# Patient Record
Sex: Male | Born: 2000 | Race: White | Hispanic: No | Marital: Single | Smoking: Never smoker
Health system: Southern US, Community
[De-identification: ages and names within clinical notes are randomized; demographics above are authoritative.]

## PROBLEM LIST (undated history)

## (undated) DIAGNOSIS — F419 Anxiety disorder, unspecified: Secondary | ICD-10-CM

## (undated) DIAGNOSIS — F410 Panic disorder [episodic paroxysmal anxiety] without agoraphobia: Secondary | ICD-10-CM

## (undated) HISTORY — PX: TONSILLECTOMY: SUR1361

## (undated) HISTORY — PX: CYSTOSCOPY: SUR368

## (undated) HISTORY — PX: UPPER GASTROINTESTINAL ENDOSCOPY: SHX188

---
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
## 2020-05-08 ENCOUNTER — Emergency Department: Payer: 59

## 2020-05-08 ENCOUNTER — Other Ambulatory Visit: Payer: Self-pay

## 2020-05-08 ENCOUNTER — Emergency Department
Admission: EM | Admit: 2020-05-08 | Discharge: 2020-05-08 | Disposition: A | Discharge status: Home / Self Care | Payer: 59 | Attending: Emergency Medicine | Admitting: Emergency Medicine

## 2020-05-08 DIAGNOSIS — Z2839 Other underimmunization status: Secondary | ICD-10-CM | POA: Diagnosis not present

## 2020-05-08 DIAGNOSIS — Y92214 College as the place of occurrence of the external cause: Secondary | ICD-10-CM | POA: Diagnosis not present

## 2020-05-08 DIAGNOSIS — M545 Low back pain, unspecified: Secondary | ICD-10-CM | POA: Diagnosis not present

## 2020-05-08 DIAGNOSIS — M542 Cervicalgia: Secondary | ICD-10-CM | POA: Insufficient documentation

## 2020-05-08 MED ORDER — MELOXICAM 15 MG PO TABS: 15.0000 mg | ORAL_TABLET | Freq: Every day | ORAL | 0 refills | Status: AC

## 2020-05-08 NOTE — Discharge Instructions (Signed)
Take Meloxicam once daily for pain and inflammation.  
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
## 2020-10-17 IMAGING — DX DG HAND COMPLETE 3+V*L*
3 series · 3 of 3 positions shown · non-contrast
Comparison: None.

CLINICAL DATA: Left hand pain and swelling following a fall.

EXAM:
LEFT HAND - COMPLETE 3+ VIEW

[hand ap]
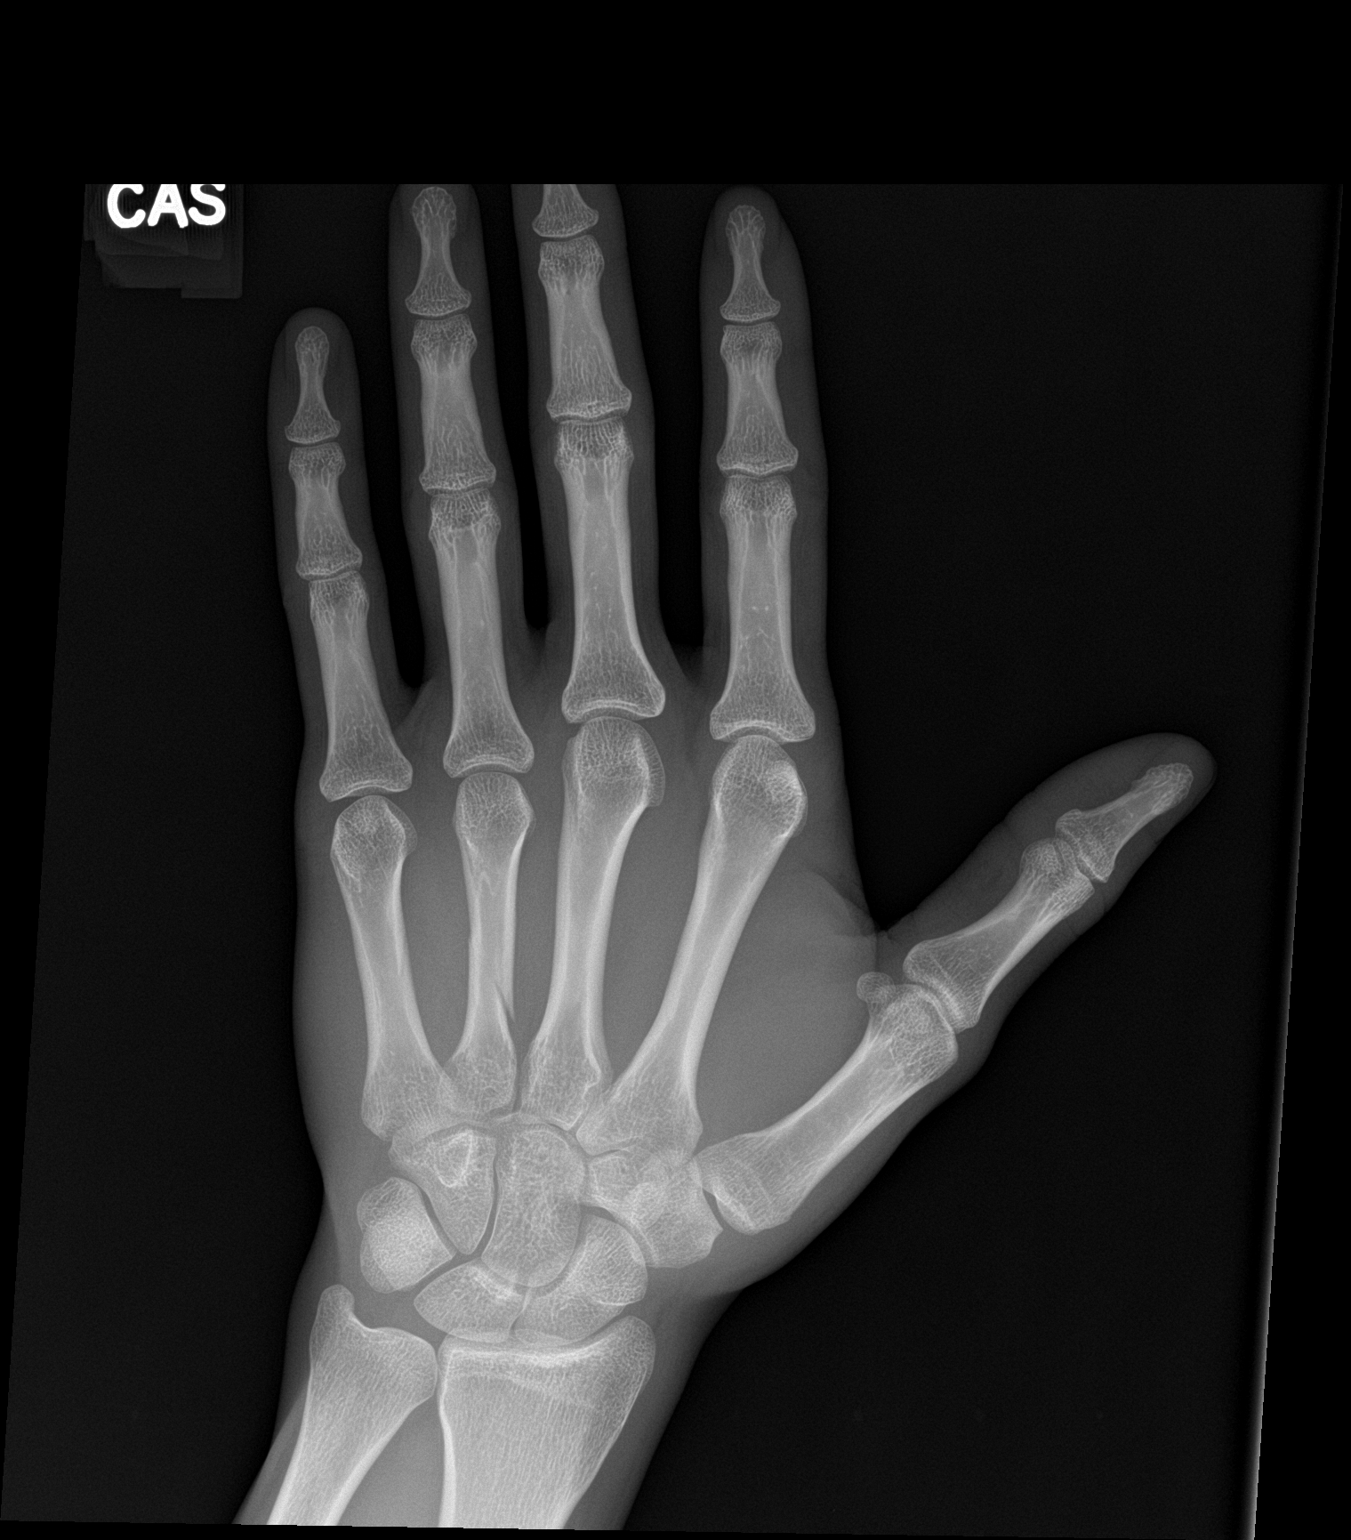

[hand obl]
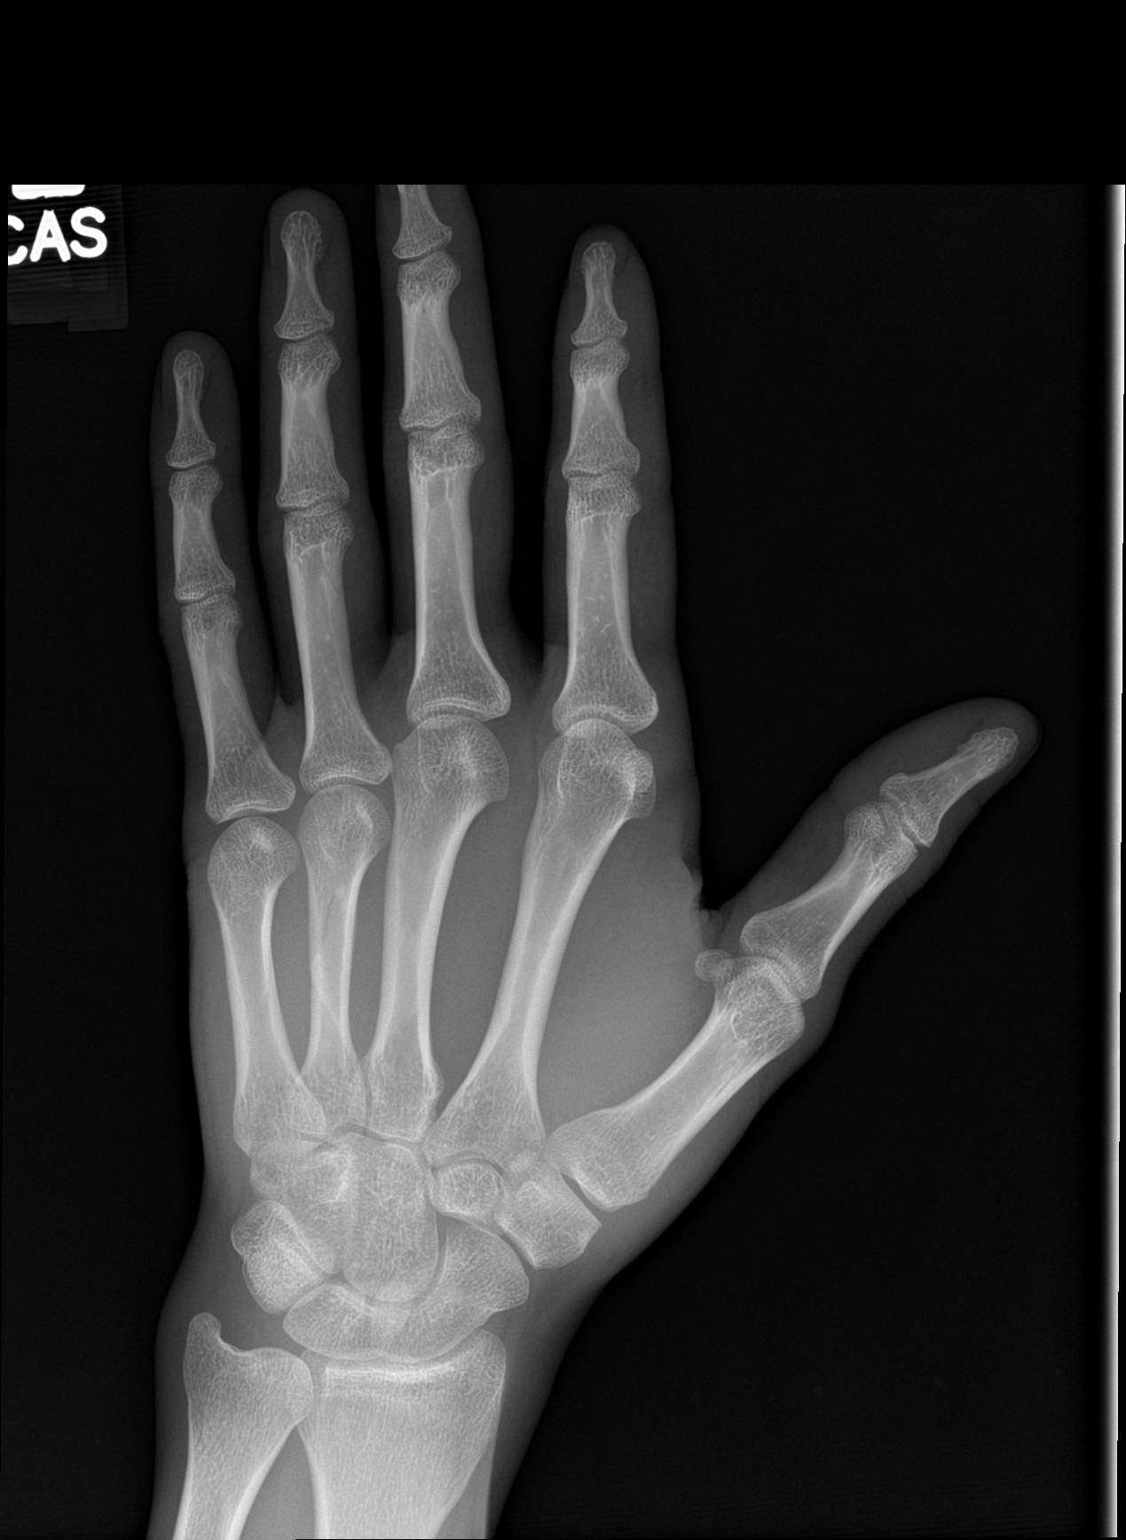

[hand lat]
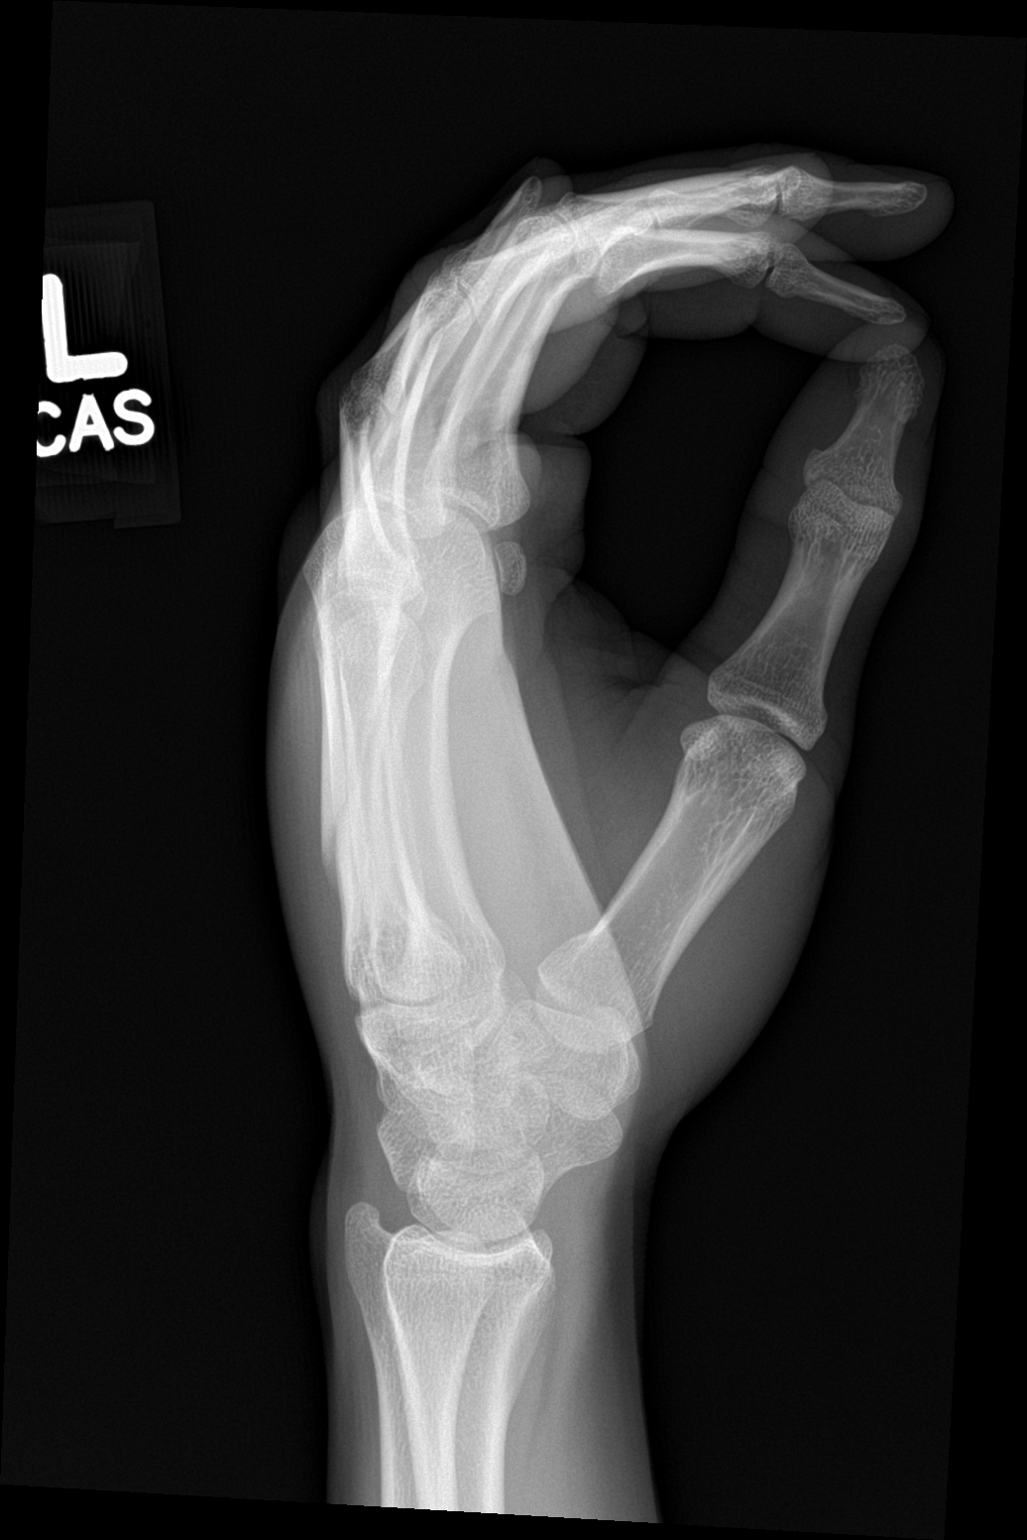

[3 of 3 positions shown; findings below may reference images not displayed]

FINDINGS: Mild to moderate diffuse dorsal soft tissue swelling. Oblique
fracture of the mid shaft of the 4th metacarpal with minimal dorsal
displacement and minimal ventral angulation of the distal fragment.
IMPRESSION: 4th metacarpal fracture as described above.

## 2020-12-12 ENCOUNTER — Other Ambulatory Visit: Payer: Self-pay | Admitting: Adult Health

## 2020-12-18 ENCOUNTER — Encounter: Payer: Self-pay | Admitting: Psychiatry

## 2020-12-18 ENCOUNTER — Ambulatory Visit (INDEPENDENT_AMBULATORY_CARE_PROVIDER_SITE_OTHER): Payer: 59 | Admitting: Psychiatry

## 2020-12-18 ENCOUNTER — Other Ambulatory Visit
Admission: RE | Admit: 2020-12-18 | Discharge: 2020-12-18 | Disposition: A | Discharge status: Home / Self Care | Payer: 59 | Attending: Psychiatry | Admitting: Psychiatry

## 2020-12-18 ENCOUNTER — Other Ambulatory Visit: Payer: Self-pay

## 2020-12-18 VITALS — BP 115/73 | HR 76 | Temp 97.9°F | Wt 163.2 lb

## 2020-12-18 DIAGNOSIS — F32A Depression, unspecified: Secondary | ICD-10-CM | POA: Diagnosis not present

## 2020-12-18 DIAGNOSIS — F410 Panic disorder [episodic paroxysmal anxiety] without agoraphobia: Secondary | ICD-10-CM | POA: Diagnosis not present

## 2020-12-18 DIAGNOSIS — F411 Generalized anxiety disorder: Secondary | ICD-10-CM | POA: Diagnosis not present

## 2020-12-18 DIAGNOSIS — F3289 Other specified depressive episodes: Secondary | ICD-10-CM | POA: Insufficient documentation

## 2020-12-18 LAB — TSH: TSH: 2.924 u[IU]/mL (ref 0.350–4.500)

## 2020-12-18 MED ORDER — PROPRANOLOL HCL 10 MG PO TABS: 10.0000 mg | ORAL_TABLET | Freq: Every day | ORAL | 1 refills | Status: DC | PRN

## 2020-12-18 MED ORDER — BUPROPION HCL 75 MG PO TABS: 75.0000 mg | ORAL_TABLET | Freq: Every morning | ORAL | 0 refills | Status: DC

## 2020-12-18 MED ORDER — SERTRALINE HCL 100 MG PO TABS: 100.0000 mg | ORAL_TABLET | Freq: Every day | ORAL | 0 refills | Status: DC

## 2020-12-18 NOTE — Patient Instructions (Addendum)
Propranolol Tablets What is this medication? PROPRANOLOL (proe PRAN oh lole) treats many conditions such as high blood pressure, tremors, and a type of arrhythmia known as AFib (atrial fibrillation). It works by lowering your blood pressure and heart rate, making it easier for your heart to pump blood to the rest of your body. It may be used to prevent migraine headaches. It works by relaxing the blood vessels in the brain that cause migraines. It belongs to a group of medications called beta blockers. This medicine may be used for other purposes; ask your health care provider or pharmacist if you have questions. COMMON BRAND NAME(S): Inderal What should I tell my care team before I take this medication? They need to know if you have any of these conditions: Circulation problems or blood vessel disease Diabetes History of heart attack or heart disease, vasospastic angina Kidney disease Liver disease Lung or breathing disease, like asthma or emphysema Pheochromocytoma Slow heart rate Thyroid disease An unusual or allergic reaction to propranolol, other beta-blockers, medications, foods, dyes, or preservatives Pregnant or trying to get pregnant Breast-feeding How should I use this medication? Take this medication by mouth. Take it as directed on the prescription label at the same time every day. Keep taking it unless your care team tells you to stop. Talk to your care team about the use of this medication in children. Special care may be needed. Overdosage: If you think you have taken too much of this medicine contact a poison control center or emergency room at once. NOTE: This medicine is only for you. Do not share this medicine with others. What if I miss a dose? If you miss a dose, take it as soon as you can. If it is almost time for your next dose, take only that dose. Do not take double or extra doses. What may interact with this medication? Do not take this medication with any of the  following: Feverfew Phenothiazines like chlorpromazine, mesoridazine, prochlorperazine, thioridazine This medication may also interact with the following: Aluminum hydroxide gel Antipyrine Antiviral medications for HIV or AIDS Barbiturates like phenobarbital Certain medications for blood pressure, heart disease, irregular heart beat Cimetidine Ciprofloxacin Diazepam Fluconazole Haloperidol Isoniazid Medications for cholesterol like cholestyramine or colestipol Medications for mental depression Medications for migraine headache like almotriptan, eletriptan, frovatriptan, naratriptan, rizatriptan, sumatriptan, zolmitriptan NSAIDs, medications for pain and inflammation, like ibuprofen or naproxen Phenytoin Rifampin Teniposide Theophylline Thyroid medications Tolbutamide Warfarin Zileuton This list may not describe all possible interactions. Give your health care provider a list of all the medicines, herbs, non-prescription drugs, or dietary supplements you use. Also tell them if you smoke, drink alcohol, or use illegal drugs. Some items may interact with your medicine. What should I watch for while using this medication? Visit your care team for regular checks on your progress. Check your blood pressure as directed. Ask your care team what your blood pressure should be. Also, find out when you should contact him or her. Do not treat yourself for coughs, colds, or pain while you are using this medication without asking your care team for advice. Some medications may increase your blood pressure. You may get drowsy or dizzy. Do not drive, use machinery, or do anything that needs mental alertness until you know how this medication affects you. Do not stand up or sit up quickly, especially if you are an older patient. This reduces the risk of dizzy or fainting spells. Alcohol may interfere with the effect of this medication. Avoid alcoholic drinks.  This medication may increase blood sugar.  Ask your care team if changes in diet or medications are needed if you have diabetes. What side effects may I notice from receiving this medication? Side effects that you should report to your care team as soon as possible: Allergic reactions--skin rash, itching, hives, swelling of the face, lips, tongue, or throat Heart failure--shortness of breath, swelling of the ankles, feet, or hands, sudden weight gain, unusual weakness or fatigue Low blood pressure--dizziness, feeling faint or lightheaded, blurry vision Raynaud's--cool, numb, or painful fingers or toes that may change color from pale, to blue, to red Redness, blistering, peeling, or loosening of the skin, including inside the mouth Slow heartbeat--dizziness, feeling faint or lightheaded, confusion, trouble breathing, unusual weakness or fatigue Worsening mood, feelings of depression Side effects that usually do not require medical attention (report to your care team if they continue or are bothersome): Change in sex drive or performance Diarrhea Dizziness Fatigue Headache This list may not describe all possible side effects. Call your doctor for medical advice about side effects. You may report side effects to FDA at 1-800-FDA-1088. Where should I keep my medication? Keep out of the reach of children and pets. Store at room temperature between 20 and 25 degrees C (68 and 77 degrees F). Protect from light. Throw away any unused medication after the expiration date. NOTE: This sheet is a summary. It may not cover all possible information. If you have questions about this medicine, talk to your doctor, pharmacist, or health care provider.  2022 Elsevier/Gold Standard (2020-10-07 00:00:00) Bupropion Tablets (Depression/Mood Disorders) What is this medication? BUPROPION (byoo PROE pee on) treats depression. It increases norepinephrine and dopamine in the brain, hormones that help regulate mood. It belongs to a group of medications called  NDRIs. This medicine may be used for other purposes; ask your health care provider or pharmacist if you have questions. COMMON BRAND NAME(S): Wellbutrin What should I tell my care team before I take this medication? They need to know if you have any of these conditions: An eating disorder, such as anorexia or bulimia Bipolar disorder or psychosis Diabetes or high blood sugar, treated with medication Glaucoma Heart disease, previous heart attack, or irregular heart beat Head injury or brain tumor High blood pressure Kidney or liver disease Seizures Suicidal thoughts or a previous suicide attempt Tourette's syndrome Weight loss An unusual or allergic reaction to bupropion, other medications, foods, dyes, or preservatives Pregnant or trying to become pregnant Breast-feeding How should I use this medication? Take this medication by mouth with a glass of water. Follow the directions on the prescription label. You can take it with or without food. If it upsets your stomach, take it with food. Take your medication at regular intervals. Do not take your medication more often than directed. Do not stop taking this medication suddenly except upon the advice of your care team. Stopping this medication too quickly may cause serious side effects or your condition may worsen. A special MedGuide will be given to you by the pharmacist with each prescription and refill. Be sure to read this information carefully each time. Talk to your care team regarding the use of this medication in children. Special care may be needed. Overdosage: If you think you have taken too much of this medicine contact a poison control center or emergency room at once. NOTE: This medicine is only for you. Do not share this medicine with others. What if I miss a dose? If you miss a  dose, take it as soon as you can. If it is less than four hours to your next dose, take only that dose and skip the missed dose. Do not take double or  extra doses. What may interact with this medication? Do not take this medication with any of the following: Linezolid MAOIs like Azilect, Carbex, Eldepryl, Marplan, Nardil, and Parnate Methylene blue (injected into a vein) Other medications that contain bupropion like Zyban This medication may also interact with the following: Alcohol Certain medications for anxiety or sleep Certain medications for blood pressure like metoprolol, propranolol Certain medications for depression or psychotic disturbances Certain medications for HIV or AIDS like efavirenz, lopinavir, nelfinavir, ritonavir Certain medications for irregular heart beat like propafenone, flecainide Certain medications for Parkinson's disease like amantadine, levodopa Certain medications for seizures like carbamazepine, phenytoin, phenobarbital Cimetidine Clopidogrel Cyclophosphamide Digoxin Furazolidone Isoniazid Nicotine Orphenadrine Procarbazine Steroid medications like prednisone or cortisone Stimulant medications for attention disorders, weight loss, or to stay awake Tamoxifen Theophylline Thiotepa Ticlopidine Tramadol Warfarin This list may not describe all possible interactions. Give your health care provider a list of all the medicines, herbs, non-prescription drugs, or dietary supplements you use. Also tell them if you smoke, drink alcohol, or use illegal drugs. Some items may interact with your medicine. What should I watch for while using this medication? Tell your care team if your symptoms do not get better or if they get worse. Visit your care team for regular checks on your progress. Because it may take several weeks to see the full effects of this medication, it is important to continue your treatment as prescribed. Watch for new or worsening thoughts of suicide or depression. This includes sudden changes in mood, behavior, or thoughts. These changes can happen at any time but are more common in the  beginning of treatment or after a change in dose. Call your care team right away if you experience these thoughts or worsening depression. Manic episodes may happen in patients with bipolar disorder who take this medication. Watch for changes in feelings or behaviors such as feeling anxious, nervous, agitated, panicky, irritable, hostile, aggressive, impulsive, severely restless, overly excited and hyperactive, or trouble sleeping. These symptoms can happen at anytime but are more common in the beginning of treatment or after a change in dose. Call your care team right away if you notice any of these symptoms. This medication may cause serious skin reactions. They can happen weeks to months after starting the medication. Contact your care team right away if you notice fevers or flu-like symptoms with a rash. The rash may be red or purple and then turn into blisters or peeling of the skin. Or, you might notice a red rash with swelling of the face, lips or lymph nodes in your neck or under your arms. Avoid drinks that contain alcohol while taking this medication. Drinking large amounts of alcohol, using sleeping or anxiety medications, or quickly stopping the use of these agents while taking this medication may increase your risk for a seizure. Do not drive or use heavy machinery until you know how this medication affects you. This medication can impair your ability to perform these tasks. Do not take this medication close to bedtime. It may prevent you from sleeping. Your mouth may get dry. Chewing sugarless gum or sucking hard candy, and drinking plenty of water may help. Contact your care team if the problem does not go away or is severe. What side effects may I notice from receiving this medication?  Side effects that you should report to your care team as soon as possible: Allergic reactions--skin rash, itching, hives, swelling of the face, lips, tongue, or throat Increase in blood pressure Mood and  behavior changes--anxiety, nervousness, confusion, hallucinations, irritability, hostility, thoughts of suicide or self-harm, worsening mood, feelings of depression Redness, blistering, peeling, or loosening of the skin, including inside the mouth Seizures Sudden eye pain or change in vision such as blurry vision, seeing halos around lights, vision loss Side effects that usually do not require medical attention (report to your care team if they continue or are bothersome): Constipation Dizziness Dry mouth Loss of appetite Nausea Tremors or shaking Trouble sleeping This list may not describe all possible side effects. Call your doctor for medical advice about side effects. You may report side effects to FDA at 1-800-FDA-1088. Where should I keep my medication? Keep out of the reach of children and pets. Store at room temperature between 20 and 25 degrees C (68 and 77 degrees F), away from direct sunlight and moisture. Keep tightly closed. Throw away any unused medication after the expiration date. NOTE: This sheet is a summary. It may not cover all possible information. If you have questions about this medicine, talk to your doctor, pharmacist, or health care provider.  2022 Elsevier/Gold Standard (2020-04-02 00:00:00)

## 2020-12-18 NOTE — Progress Notes (Signed)
Psychiatric Initial Adult Assessment   Patient Identification: Christian Moore MRN:  [id number] Date of Evaluation:  12/18/2020 Referral Source: Ron Parker NP Chief Complaint:   Chief Complaint   Establish Care; Anxiety    Visit Diagnosis:    ICD-10-CM   1. GAD (generalized anxiety disorder)  F41.1 sertraline (ZOLOFT) 100 MG tablet    buPROPion (WELLBUTRIN) 75 MG tablet    propranolol (INDERAL) 10 MG tablet    2. Panic attacks  F41.0     3. 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  Patient presented to establish care referred by [facility].  Patient reports a history of panic attacks or anxiety attacks which started in April 2020.  This was around the time when lockdown initially started due to the COVID-19 pandemic.  Patient reports he started having panic attacks every day, had chest tightness, shortness of breath feeling of impending doom, racing thoughts and so on.  Patient reports at that time it started affecting his functioning to the point that he was having trouble at school.  Patient also had urinary symptoms, frequent urination, was referred to a urologist who ruled out medical causes and stated it would have been due to his anxiety.  He was started (by a psychiatrist at [facility] ) on sertraline 25 mg which was increased gradually to 150 mg.  The sertraline helped his subjective feeling of anxiety however he continued to have physical symptoms of chest tightness and shortness of breath and so on.  Patient reports most recently he had anxiety or panic attacks when he went to the gym and worked out  for the first time after a long time.  He reports when he started working out his heart started racing and that could have triggered another anxiety attack.  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  Follows up every 2 weeks or so.  He tries to work on his coping techniques, relaxation techniques when he has panic attacks however it does not help all the time.  Patient also reports depressive symptoms like hypersomnia, fatigue, tiredness, inability to attend classes.  In the past month he may have missed 50% of his classes due to his excessive sleepiness and tiredness.  He reports he goes to bed at around 11 PM or 12 AM.  Patient reports he stays in bed until 9 or 10 AM in the morning.  He does not have any interrupted sleep however does not want to get out of bed most of the time.  This has been going on since the past several months.  Patient does report a history of trauma, choking episode when he was 20 years old at a restaurant.  He reports after that for at least 2 years he was afraid to eat .  He started therapy at that time which helped.  Patient reports he does not have any binging on food, anorexia.  He is currently on a meal plan at [facility], eats 3 meals to 2 meals per day.  His appetite varies.  Patient however continues to have trouble swallowing couple of times a  month however since he still manages to eat.  Patient denies any substance abuse problems.  Patient reports he is passing all his classes however would like to do better.  Patient has a good relationship with his family who lives in [place].  Patient denies any other concerns today. Associated Signs/Symptoms: Depression Symptoms:  hypersomnia, fatigue, anxiety, loss of energy/fatigue, (Hypo) Manic Symptoms:   Denies Anxiety Symptoms:  Excessive Worry, Panic Symptoms, Psychotic Symptoms:   Denies PTSD Symptoms: Had a traumatic exposure:  as noted above  Past Psychiatric History: Patient  was diagnosed with generalized anxiety by a psychiatrist at [facility] couple of years ago.  Patient denies inpatient mental health admissions.  Patient denies suicidality.  Patient has been in therapy in the past, currently undergoing therapy at [facility].  Previous Psychotropic Medications: Yes Zoloft  Substance Abuse History in the last 12 months:  No.  Consequences of Substance Abuse: Negative  Past Medical History:  Past Medical History:  Diagnosis Date   Anxiety    Panic attacks     Past Surgical History:  Procedure Laterality Date   CYSTOSCOPY     LAPAROSCOPIC APPENDECTOMY N/A 10/24/2018   Procedure: APPENDECTOMY LAPAROSCOPIC;  Surgeon: Henrene Dodge, MD;  Location: [facility] ORS;  Service: General;  Laterality: N/A;   TONSILLECTOMY     UPPER GASTROINTESTINAL ENDOSCOPY      Family Psychiatric History: As noted below  Family History:  Family History  Problem Relation Age of Onset   Anxiety disorder Mother     Social History:   Social History   Socioeconomic History   Marital status: Single    Spouse name: Not on file   Number of children: 0   Years of education: Not on file   Highest education level: Not on file  Occupational History   Not on file  Tobacco Use   Smoking status: Never   Smokeless tobacco: Never  Vaping Use   Vaping Use: Never used  Substance and Sexual Activity   Alcohol use: Not Currently    Comment: occasional   Drug use: Never   Sexual activity: Not Currently  Other Topics Concern   Not on file  Social History Narrative   Not on file   Social Determinants of Health   Financial Resource Strain: Not on file  Food Insecurity: Not on file  Transportation Needs: Not on file  Physical Activity: Not on file  Stress: Not on file  Social Connections: Not on file    Additional Social History: Patient was born in [place].  He has 2 younger sisters.  He had a good childhood.  Patient currently lives on [facility],  junior at [employer], Glass blower/designer in Engineer, maintenance (IT).  He is passing all his grades.  He has a good relationship with his sisters and his family.  Patient does report a history of trauma as noted above.  Allergies:  No Known Allergies  Metabolic Disorder Labs: No results found for: HGBA1C, MPG No results found for: PROLACTIN No results found for: CHOL, TRIG, HDL, CHOLHDL, VLDL, LDLCALC Lab Results  Component Value Date   TSH 2.924 12/18/2020    Therapeutic Level Labs: No results found for: LITHIUM No results found for: CBMZ No results found for: VALPROATE  Current Medications: Current Outpatient Medications  Medication Sig Dispense Refill   buPROPion (WELLBUTRIN) 75 MG tablet Take 1 tablet (75 mg total) by mouth in the morning. 30 tablet 0   propranolol (INDERAL) 10 MG tablet Take 1 tablet (10  mg total) by mouth daily as needed. For severe anxiety attacks , limit use 30 tablet 1   sertraline (ZOLOFT) 100 MG tablet Take 1 tablet (100 mg total) by mouth daily. Take daily at 8 AM 90 tablet 0   No current facility-administered medications for this visit.    Musculoskeletal: Strength & Muscle Tone: within normal limits Gait & Station: normal Patient leans: N/A  Psychiatric Specialty Exam: Review of Systems  Constitutional:  Positive for fatigue.  Psychiatric/Behavioral:  Positive for sleep disturbance. The patient is nervous/anxious.   All other systems reviewed and are negative.  Blood pressure 115/73, pulse 76, temperature 97.9 F (36.6 C), temperature source Temporal, weight 163 lb 3.2 oz (74 kg).Body mass index is 21.53 kg/m.  General Appearance: Casual  Eye Contact:  Fair  Speech:  Normal Rate  Volume:  Normal  Mood:  Anxious  Affect:  Congruent  Thought Process:  Goal Directed and Descriptions of Associations: Intact  Orientation:  Full (Time, Place, and Person)  Thought Content:  Logical  Suicidal Thoughts:  No  Homicidal Thoughts:  No  Memory:  Immediate;    Fair Recent;   Fair Remote;   Fair  Judgement:  Fair  Insight:  Fair  Psychomotor Activity:  Normal  Concentration:  Concentration: Fair and Attention Span: Fair  Recall:  [employer] of Knowledge:Fair  Language: Fair  Akathisia:  No  Handed:  Right  AIMS (if indicated):  not done  Assets:  Communication Skills Desire for Improvement Housing Social Support Talents/Skills Transportation Vocational/Educational  ADL's:  Intact  Cognition: WNL  Sleep:   excessive   Screenings: GAD-7    Flowsheet Row [facility] Visit [facility] 12/18/2020 in [facility]  Total GAD-7 Score 6      PHQ2-9    Flowsheet Row Office Visit from 12/18/2020 in [facility]  PHQ-2 Total Score 1  PHQ-9 Total Score 10      Flowsheet Row ED from 05/08/2020 in [facility]  C-SSRS RISK CATEGORY No Risk       Assessment and Plan: Christian Moore is a 20 year old Caucasian male, Holiday representative at [employer], Glass blower/designer in [employer], maintenance (IT), lives on campus, has a history of anxiety, was evaluated in office today, presented to establish care.  Patient with anxiety, panic attacks as well as excessive sleepiness and missing classes, will benefit from the following plan. The patient demonstrates the following risk factors for suicide: Chronic risk factors for suicide include: psychiatric disorder of anxiety . Acute risk factors for suicide include: NA. Protective factors for this patient include: positive social support, positive therapeutic relationship, coping skills, hope for the future, and life satisfaction. Considering these factors, the overall suicide risk at this point appears to be low. Patient is appropriate for outpatient follow up.   Plan Generalized anxiety disorder-unstable Reduce Zoloft to 100 mg p.o. daily due to excessive tiredness, excessive sleepiness. Advised patient to take Zoloft in the morning. Start Wellbutrin  75 mg p.o. daily in the morning. Provided medication education including worsening suicidality.  Crisis plan discussed with patient.  Patient advised to take Wellbutrin early in the morning due to its effect on his sleep. Patient is currently under the care of psychotherapist-continue CBT at [facility].  Patient to sign an ROI to obtain medical records.  We will coordinate care.  Panic attacks-unstable Start propranolol 10 mg p.o. daily as needed for severe anxiety attacks Continue Zoloft as prescribed for now.  Depressive disorder unspecified-unstable  Start Wellbutrin 75 mg p.o. daily in the morning Continue Zoloft however at the reduced dosage as noted below. Continue CBT  Will order TSH labs-patient to go to [facility] lab.  Discussed establishing care with her primary care provider and get a medical clearance given his symptoms of heart palpitation.  Follow-up in clinic in 2 weeks or sooner in office.  This note was generated in part or whole with voice recognition software. Voice recognition is usually quite accurate but there are transcription errors that can and very often do occur. I apologize for any typographical errors that were not detected and corrected.     Jomarie Longs, MD 11/18/20228:57 AM

## 2021-01-01 ENCOUNTER — Ambulatory Visit (INDEPENDENT_AMBULATORY_CARE_PROVIDER_SITE_OTHER): Payer: 59 | Admitting: Psychiatry

## 2021-01-01 ENCOUNTER — Encounter: Payer: Self-pay | Admitting: Psychiatry

## 2021-01-01 ENCOUNTER — Other Ambulatory Visit: Payer: Self-pay

## 2021-01-01 VITALS — BP 102/67 | HR 78 | Temp 97.7°F | Ht 73.0 in | Wt 163.0 lb

## 2021-01-01 DIAGNOSIS — F410 Panic disorder [episodic paroxysmal anxiety] without agoraphobia: Secondary | ICD-10-CM | POA: Diagnosis not present

## 2021-01-01 DIAGNOSIS — F32A Depression, unspecified: Secondary | ICD-10-CM

## 2021-01-01 DIAGNOSIS — F411 Generalized anxiety disorder: Secondary | ICD-10-CM | POA: Diagnosis not present

## 2021-01-01 MED ORDER — BUPROPION HCL 75 MG PO TABS: 75.0000 mg | ORAL_TABLET | Freq: Every morning | ORAL | 1 refills | Status: DC

## 2021-01-01 NOTE — Progress Notes (Signed)
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  Patient today reports he is compliant on his medications.  Patient reports he has not had any significant panic attacks since his last visit.  He has not needed the propranolol yet.  Patient denies any side effects to the Wellbutrin.  He however reports he continues to feel tired during the day and hence would like to give this medication more time.  He is happy that it has not caused any side effects.  He wants to stay on the current dosage and is not interested in dosage increase.  Patient reports he is going to go back home for his break which is going to be for the next 3 weeks.  His family lives in [place].  Patient denies any suicidality, homicidality or perceptual disturbances.  Patient denies any substance abuse problems.  Patient denies any other concerns today.  Visit Diagnosis:    ICD-10-CM   1. GAD (generalized anxiety disorder)  F41.1 buPROPion (WELLBUTRIN) 75 MG tablet    2. Panic attacks  F41.0     3. Depression, unspecified depression type  F32.A buPROPion (WELLBUTRIN) 75 MG tablet      Past Psychiatric History: Reviewed past psychiatric history from progress note on 12/18/2020.  Past trials of Zoloft.  Past Medical History:  Past Medical History:  Diagnosis Date   Anxiety    Panic attacks     Past Surgical History:  Procedure Laterality Date   CYSTOSCOPY     LAPAROSCOPIC APPENDECTOMY N/A 10/24/2018   Procedure: APPENDECTOMY LAPAROSCOPIC;  Surgeon: Henrene Dodge, MD;  Location: [facility] ORS;  Service: General;  Laterality: N/A;   TONSILLECTOMY     UPPER GASTROINTESTINAL ENDOSCOPY      Family Psychiatric History: Reviewed family  psychiatric history from progress note on 12/18/2020.  Family History:  Family History  Problem Relation Age of Onset   Anxiety disorder Mother     Social History: Reviewed social history from progress note on 12/18/2020. Social History   Socioeconomic History   Marital status: Single    Spouse name: Not on file   Number of children: 0   Years of education: Not on file   Highest education level: Not on file  Occupational History   Not on file  Tobacco Use   Smoking status: Never   Smokeless tobacco: Never  Vaping Use   Vaping Use: Never used  Substance and Sexual Activity   Alcohol use: Not Currently    Comment: occasional   Drug use: Never   Sexual activity: Not Currently  Other Topics Concern   Not on file  Social History Narrative   Not on file   Social Determinants of Health   Financial Resource Strain: Not on file  Food Insecurity: Not on file  Transportation Needs: Not on file  Physical Activity: Not on file  Stress: Not on file  Social Connections: Not on file    Allergies: No Known Allergies  Metabolic Disorder Labs: No results found for: HGBA1C, MPG No results found for: PROLACTIN No results found for: CHOL, TRIG, HDL, CHOLHDL, VLDL, LDLCALC Lab Results  Component Value Date   TSH 2.924 12/18/2020    Therapeutic Level Labs: No results found  for: LITHIUM No results found for: VALPROATE No components found for:  CBMZ  Current Medications: Current Outpatient Medications  Medication Sig Dispense Refill   buPROPion (WELLBUTRIN) 75 MG tablet Take 1 tablet (75 mg total) by mouth in the morning. 30 tablet 1   propranolol (INDERAL) 10 MG tablet Take 1 tablet (10 mg total) by mouth daily as needed. For severe anxiety attacks , limit use 30 tablet 1   sertraline (ZOLOFT) 100 MG tablet Take 1 tablet (100 mg total) by mouth daily. Take daily at 8 AM 90 tablet 0   No current facility-administered medications for this visit.      Musculoskeletal: Strength & Muscle Tone: within normal limits Gait & Station: normal Patient leans: N/A  Psychiatric Specialty Exam: Review of Systems  Constitutional:  Positive for fatigue.  Psychiatric/Behavioral:  The patient is nervous/anxious.   All other systems reviewed and are negative.  Blood pressure 102/67, pulse 78, temperature 97.7 F (36.5 C), height 6\' 1"  (1.854 m), weight 163 lb (73.9 kg), SpO2 98 %.Body mass index is 21.51 kg/m.  General Appearance: Fairly Groomed  Eye Contact:  Fair  Speech:  Normal Rate  Volume:  Normal  Mood:  Anxious improving  Affect:  Congruent  Thought Process:  Goal Directed and Descriptions of Associations: Intact  Orientation:  Full (Time, Place, and Person)  Thought Content: Logical   Suicidal Thoughts:  No  Homicidal Thoughts:  No  Memory:  Immediate;   Fair Recent;   Fair Remote;   Fair  Judgement:  Fair  Insight:  Fair  Psychomotor Activity:  Normal  Concentration:  Concentration: Fair and Attention Span: Fair  Recall:  of Knowledge: Fair  Language: Fair  Akathisia:  No  Handed:  Right  AIMS (if indicated): done- 0  Assets:  Communication Skills Desire for Improvement Housing Social Support  ADL's:  Intact  Cognition: WNL  Sleep:  Fair   Screenings: GAD-7    Flowsheet Row Office Visit from 01/01/2021 in [facility] Office Visit from 12/18/2020 in [facility]  Total GAD-7 Score 6 6      PHQ2-9    Flowsheet Row Office Visit from 01/01/2021 in [facility] Office Visit from 12/18/2020 in [facility]  PHQ-2 Total Score 1 1  PHQ-9 Total Score 2 10      Flowsheet Row ED from 05/08/2020 in [facility]  C-SSRS RISK CATEGORY No Risk        Assessment and Plan: Christian Moore is a 20 year old Caucasian male, junior at [employer], [employer]  in [employer], lives on campus, has a history of anxiety, panic attacks was evaluated in office today.  Patient currently tolerating the Wellbutrin, will benefit from the following plan.  Plan Generalized anxiety disorder-unstable Zoloft at reduced dosage of 100 mg p.o. daily.  Dose reduced due to excessive tiredness, sleepiness Continue Wellbutrin 75 mg p.o. daily in the morning.  Patient would like to give this dose more time. Continue C[facility] at [facility].  Panic attacks-improving Propranolol 10 mg p.o. daily as needed.  He has not used it yet. Zoloft as prescribed.  Depressive disorder unspecified-improving PHQ 9 today equals 2-improved compared to his last visit.  Patient however continues to struggle with tiredness and fatigue. Continue Wellbutrin 75 mg p.o. daily in the morning Continue Zoloft as prescribed  TSH labs reviewed and discussed-dated 12/18/2020-within normal limits.  Follow-up in clinic in 6 weeks or  sooner, a person on virtual.  This note was generated in part or whole with voice recognition software. Voice recognition is usually quite accurate but there are transcription errors that can and very often do occur. I apologize for any typographical errors that were not detected and corrected.     Jomarie Longs, MD 01/01/2021, 6:23 PM

## 2021-01-09 ENCOUNTER — Other Ambulatory Visit: Payer: Self-pay | Admitting: Psychiatry

## 2021-01-09 DIAGNOSIS — F32A Depression, unspecified: Secondary | ICD-10-CM

## 2021-01-09 DIAGNOSIS — F411 Generalized anxiety disorder: Secondary | ICD-10-CM

## 2021-02-05 ENCOUNTER — Other Ambulatory Visit: Payer: Self-pay | Admitting: Psychiatry

## 2021-02-05 DIAGNOSIS — F32A Depression, unspecified: Secondary | ICD-10-CM

## 2021-02-05 DIAGNOSIS — F411 Generalized anxiety disorder: Secondary | ICD-10-CM

## 2021-02-12 ENCOUNTER — Ambulatory Visit (INDEPENDENT_AMBULATORY_CARE_PROVIDER_SITE_OTHER): Payer: 59 | Admitting: Psychiatry

## 2021-02-12 ENCOUNTER — Other Ambulatory Visit: Payer: Self-pay

## 2021-02-12 ENCOUNTER — Encounter: Payer: Self-pay | Admitting: Psychiatry

## 2021-02-12 VITALS — BP 122/83 | HR 67 | Temp 97.6°F | Wt 163.2 lb

## 2021-02-12 DIAGNOSIS — F3289 Other specified depressive episodes: Secondary | ICD-10-CM

## 2021-02-12 DIAGNOSIS — F410 Panic disorder [episodic paroxysmal anxiety] without agoraphobia: Secondary | ICD-10-CM

## 2021-02-12 DIAGNOSIS — F411 Generalized anxiety disorder: Secondary | ICD-10-CM | POA: Diagnosis not present

## 2021-02-12 MED ORDER — SERTRALINE HCL 100 MG PO TABS: 100.0000 mg | ORAL_TABLET | Freq: Every day | ORAL | 0 refills | Status: DC

## 2021-02-12 NOTE — Progress Notes (Signed)
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  Patient today reports he had a good holiday season.  Patient also reports he had to go out on conference to [place] for a few days beginning of January.  He enjoyed that trip and got to travel with friends.  Patient reports he is currently back at [employer] and is trying to get back into a routine.  The holidays as well as the conference did affect his sleep schedule.  He is currently trying to work on that.  He goes to bed anywhere around midnight and currently wakes up around 8 AM.  There may have been 1 day when he overslept.  Patient however reports overall since being on the Wellbutrin he has been more energetic and motivated during the day.  Patient does report mild anxiety symptoms every other day or so which happens towards the end of the day.  He has been writing it down, trying to cope by using coping techniques.  It usually lasts for 30 minutes to an hour and goes away.  Patient has not used the propranolol as needed for anxiety.  However is willing to use it as needed.  Currently is compliant on the sertraline.  Denies side effects.  Looks forward to his birthday that is coming up.  He is going to be 21 years old and wonders whether it is okay to have an alcoholic drink.  Patient denies any suicidality, homicidality or perceptual disturbances.  Patient denies any other concerns today.  Visit Diagnosis:    ICD-10-CM   1. GAD (generalized anxiety disorder)  F41.1 sertraline (ZOLOFT) 100 MG tablet    2. Panic attacks  F41.0     3. Other specified depressive episodes  F32.89 sertraline (ZOLOFT) 100 MG tablet      Past Psychiatric History:  Reviewed past psychiatric history from progress note on 12/18/2020.  Past trials of Zoloft.  Past Medical History:  Past Medical History:  Diagnosis Date   Anxiety    Panic attacks     Past Surgical History:  Procedure Laterality Date   CYSTOSCOPY     LAPAROSCOPIC APPENDECTOMY N/A 10/24/2018   Procedure: APPENDECTOMY LAPAROSCOPIC;  Surgeon: Henrene DodgePiscoya, Jose, MD;  Location: [facility] ORS;  Service: General;  Laterality: N/A;   TONSILLECTOMY     UPPER GASTROINTESTINAL ENDOSCOPY      Family Psychiatric History: Reviewed family psychiatric history from progress note on 12/18/2020.  Family History:  Family History  Problem Relation Age of Onset   Anxiety disorder Mother     Social History: Reviewed social history from progress note on 12/18/2020. Social History   Socioeconomic History   Marital status: Single    Spouse name: Not on file   Number of children: 0   Years of education: Not on file   Highest education level: Not on file  Occupational History   Not on file  Tobacco Use   Smoking status: Never   Smokeless tobacco: Never  Vaping Use   Vaping Use: Never used  Substance and Sexual Activity   Alcohol use: Not Currently    Comment: occasional   Drug use: Never   Sexual activity: Not Currently  Other Topics Concern  Not on file  Social History Narrative   Not on file   Social Determinants of Health   Financial Resource Strain: Not on file  Food Insecurity: Not on file  Transportation Needs: Not on file  Physical Activity: Not on file  Stress: Not on file  Social Connections: Not on file    Allergies: No Known Allergies  Metabolic Disorder Labs: No results found for: HGBA1C, MPG No results found for: PROLACTIN No results found for: CHOL, TRIG, HDL, CHOLHDL, VLDL, LDLCALC Lab Results  Component Value Date   TSH 2.924 12/18/2020    Therapeutic Level Labs: No results found for: LITHIUM No results found for: VALPROATE No components found for:   CBMZ  Current Medications: Current Outpatient Medications  Medication Sig Dispense Refill   buPROPion (WELLBUTRIN) 75 MG tablet TAKE 1 TABLET (75 MG TOTAL) BY MOUTH IN THE MORNING. 90 tablet 0   propranolol (INDERAL) 10 MG tablet Take 1 tablet (10 mg total) by mouth daily as needed. For severe anxiety attacks , limit use 30 tablet 1   sertraline (ZOLOFT) 100 MG tablet Take 1 tablet (100 mg total) by mouth daily. Take daily at 8 AM 90 tablet 0   No current facility-administered medications for this visit.     Musculoskeletal: Strength & Muscle Tone: within normal limits Gait & Station: normal Patient leans: N/A  Psychiatric Specialty Exam: Review of Systems  Psychiatric/Behavioral:  Positive for sleep disturbance. The patient is nervous/anxious.   All other systems reviewed and are negative.  Blood pressure 122/83, pulse 67, temperature 97.6 F (36.4 C), temperature source Temporal, weight 163 lb 3.2 oz (74 kg).Body mass index is 21.53 kg/m.  General Appearance: Casual  Eye Contact:  Fair  Speech:  Clear and Coherent  Volume:  Normal  Mood:  Anxious  Affect:  Congruent  Thought Process:  Goal Directed and Descriptions of Associations: Intact  Orientation:  Full (Time, Place, and Person)  Thought Content: Logical   Suicidal Thoughts:  No  Homicidal Thoughts:  No  Memory:  Immediate;   Fair Recent;   Fair Remote;   Fair  Judgement:  Fair  Insight:  Fair  Psychomotor Activity:  Normal  Concentration:  Concentration: Fair and Attention Span: Fair  Recall:  [employer] of Knowledge: Fair  Language: Fair  Akathisia:  No  Handed:  Right  AIMS (if indicated): done, 0  Assets:  Communication Skills Desire for Improvement Housing Social Support Talents/Skills Transportation Vocational/Educational  ADL's:  Intact  Cognition: WNL  Sleep:   improving   Screenings: GAD-7    Flowsheet Row Office Visit from 02/12/2021 in [facility] Office  Visit from 01/01/2021 in [facility] Office Visit from 12/18/2020 in [facility]  Total GAD-7 Score 5 6 6       PHQ2-9    Flowsheet Row Office Visit from 02/12/2021 in [facility] Office Visit from 01/01/2021 in [facility] Office Visit from 12/18/2020 in [facility]  PHQ-2 Total Score 2 1 1   PHQ-9 Total Score 7 2 10       Flowsheet Row Office Visit from 02/12/2021 in [facility] ED from 05/08/2020 in [facility]  C-SSRS RISK CATEGORY No Risk No Risk        Assessment and Plan: Valmore Arabie is a 21 year old Caucasian male, junior at [employer], Christian Moore in 26, lives on campus, has a history of anxiety, depressive symptoms,  was evaluated in office today.  Patient is currently improving although does report mild anxiety symptoms.  Will benefit from the following plan.  Plan Generalized anxiety disorder-improving Zoloft 100 mg p.o. daily-reduced dosage. Wellbutrin 75 mg p.o. daily in the morning.  We will consider increasing the dosage in the future. Continue CBT at [facility].   Panic attacks-improving Patient advised to make use of propranolol 10 mg p.o. daily as needed as needed. Zoloft 100 mg p.o. daily.  Other specified depressive episodes-improving Wellbutrin 75 mg p.o. daily in the morning and continue Zoloft 100 mg p.o. daily  Provided education about alcohol use, use in moderation, and adverse side effects of mixing it with current psychotropic medications.  Follow-up in clinic in 2 months or sooner if needed.  This note was generated in part or whole with voice recognition software. Voice recognition is usually quite accurate but there are transcription errors that can and very often do occur. I apologize for any typographical errors that were not  detected and corrected.       Jomarie Longs, MD 02/13/2021, 9:18 AM

## 2021-02-21 ENCOUNTER — Emergency Department
Admission: EM | Admit: 2021-02-21 | Discharge: 2021-02-22 | Disposition: A | Discharge status: Home / Self Care | Payer: 59 | Attending: Emergency Medicine | Admitting: Emergency Medicine

## 2021-02-21 ENCOUNTER — Encounter: Payer: Self-pay | Admitting: Emergency Medicine

## 2021-02-21 ENCOUNTER — Emergency Department: Payer: 59

## 2021-02-21 ENCOUNTER — Other Ambulatory Visit: Payer: Self-pay

## 2021-02-21 DIAGNOSIS — R0789 Other chest pain: Secondary | ICD-10-CM | POA: Diagnosis not present

## 2021-02-21 DIAGNOSIS — R5383 Other fatigue: Secondary | ICD-10-CM | POA: Diagnosis not present

## 2021-02-21 DIAGNOSIS — Z20822 Contact with and (suspected) exposure to covid-19: Secondary | ICD-10-CM | POA: Diagnosis not present

## 2021-02-21 LAB — CBC
HCT: 48 % (ref 39.0–52.0)
Hemoglobin: 16.1 g/dL (ref 13.0–17.0)
MCH: 27.8 pg (ref 26.0–34.0)
MCHC: 33.5 g/dL (ref 30.0–36.0)
MCV: 82.8 fL (ref 80.0–100.0)
Platelets: 252 10*3/uL (ref 150–400)
RBC: 5.8 MIL/uL (ref 4.22–5.81)
RDW: 12.1 % (ref 11.5–15.5)
WBC: 7.8 10*3/uL (ref 4.0–10.5)
nRBC: 0 % (ref 0.0–0.2)

## 2021-02-21 LAB — BASIC METABOLIC PANEL
Anion gap: 9 (ref 5–15)
BUN: 13 mg/dL (ref 6–20)
CO2: 28 mmol/L (ref 22–32)
Calcium: 9.3 mg/dL (ref 8.9–10.3)
Chloride: 100 mmol/L (ref 98–111)
Creatinine, Ser: 0.86 mg/dL (ref 0.61–1.24)
GFR, Estimated: >60 mL/min (ref >60)
Glucose, Bld: 100 mg/dL — ABNORMAL HIGH (ref 70–99)
Potassium: 3.6 mmol/L (ref 3.5–5.1)
Sodium: 137 mmol/L (ref 135–145)

## 2021-02-21 LAB — TROPONIN I (HIGH SENSITIVITY)
Troponin I (High Sensitivity): 3 ng/L (ref <18)
Troponin I (High Sensitivity): 3 ng/L (ref <18)

## 2021-02-21 LAB — RESP PANEL BY RT-PCR (FLU A&B, COVID) ARPGX2
Influenza A by PCR: NEGATIVE
Influenza B by PCR: NEGATIVE
SARS Coronavirus 2 by RT PCR: NEGATIVE

## 2021-02-21 NOTE — ED Triage Notes (Signed)
Pt reports concern for myocarditis, fatigue x 3 weeks with shortness of breath and chest discomfort when working out or doing cardio

## 2021-02-22 LAB — CK: Total CK: 99 U/L (ref 49–397)

## 2021-02-22 LAB — TSH: TSH: 1.766 u[IU]/mL (ref 0.350–4.500)

## 2021-02-22 LAB — T4, FREE: Free T4: 0.81 ng/dL (ref 0.61–1.12)

## 2021-02-22 NOTE — ED Provider Notes (Signed)
[facility] Provider Note    Event Date/Time   First MD Initiated Contact with Patient 02/22/21 [phone]     (approximate)   History   Fatigue   HPI  Christian Moore is a 21 y.o. male with a history of anxiety and panic attacks who presents for evaluation of fatigue.  Patient reports that in May 2020 he had COVID.  Since then he reports that he has felt more fatigued than normal.  When he goes to the gym he feels that he is not able to exercise as much because he gets some tightness in his chest and has a little bit of trouble breathing.  He does not feel that this is related to his anxiety.  He was concerned he could have myocarditis from COVID.  He denies any history of heart disease or myocarditis in the past.  He denies any chest pain or shortness of breath or palpitations.  No lightheadedness or syncope.  No personal or family history of dysrhythmias or family history of sudden death.  No personal or family history of PE or DVT, no recent travel mobilization, no leg pain or swelling, no hemoptysis or exogenous hormones.     Past Medical History:  Diagnosis Date   Anxiety    Panic attacks     Past Surgical History:  Procedure Laterality Date   CYSTOSCOPY     LAPAROSCOPIC APPENDECTOMY N/A 10/24/2018   Procedure: APPENDECTOMY LAPAROSCOPIC;  Surgeon: Henrene DodgePiscoya, Jose, MD;  Location: [facility] ORS;  Service: General;  Laterality: N/A;   TONSILLECTOMY     UPPER GASTROINTESTINAL ENDOSCOPY       Physical Exam   Triage Vital Signs: ED Triage Vitals  Enc Vitals Group     BP 02/21/21 1915 117/77     Pulse Rate 02/21/21 1915 66     Resp 02/21/21 1915 17     Temp 02/21/21 1915 98.2 F (36.8 C)     Temp Source 02/21/21 1915 Oral     SpO2 02/21/21 1915 99 %     Weight 02/21/21 1912 160 lb (72.6 kg)     Height 02/21/21 1912 6\' 2"  (1.88 m)     Head Circumference --      Peak Flow --      Pain Score 02/21/21 1912 0     Pain Loc --      Pain Edu? --      Excl. in  GC? --     Most recent vital signs: Vitals:   02/21/21 2155 02/22/21 0204  BP: 120/78 110/76  Pulse: (!) 58 65  Resp: 20   Temp:  98.1 F (36.7 C)  SpO2: 100% 96%     Constitutional: Alert and oriented. Well appearing and in no apparent distress. HEENT:      Head: Normocephalic and atraumatic.         Eyes: Conjunctivae are normal. Sclera is non-icteric.       Mouth/Throat: Mucous membranes are moist.       Neck: Supple with no signs of meningismus. Cardiovascular: Regular rate and rhythm. No murmurs, gallops, or rubs. 2+ symmetrical distal pulses are present in all extremities.  Respiratory: Normal respiratory effort. Lungs are clear to auscultation bilaterally.  Gastrointestinal: Soft, non tender, and non distended with positive bowel sounds. No rebound or guarding. Genitourinary: No CVA tenderness. Musculoskeletal:  No edema, cyanosis, or erythema of extremities. Neurologic: Normal speech and language. Face is symmetric. Moving all extremities. No gross focal neurologic deficits are  appreciated. Skin: Skin is warm, dry and intact. No rash noted. Psychiatric: Mood and affect are normal. Speech and behavior are normal.  ED Results / Procedures / Treatments   Labs (all labs ordered are listed, but only abnormal results are displayed) Labs Reviewed  BASIC METABOLIC PANEL - Abnormal; Notable for the following components:      Result Value   Glucose, Bld 100 (*)    All other components within normal limits  RESP PANEL BY RT-PCR (FLU A&B, COVID) ARPGX2  CBC  TSH  T4, FREE  CK  TROPONIN I (HIGH SENSITIVITY)  TROPONIN I (HIGH SENSITIVITY)     EKG  ED ECG REPORT I, Nita Sickle, the attending physician, personally viewed and interpreted this ECG.  Sinus rhythm, rate of 72, normal intervals, normal axis, no ST elevations or depressions   RADIOLOGY I, Nita Sickle, attending MD, have personally viewed and interpreted the images obtained during this visit as  below:  Chest x-ray with no cardiomegaly, no pneumonia   ___________________________________________________ Interpretation by Radiologist:  DG Chest 2 View  Result Date: 02/21/2021 CLINICAL DATA:  Chest discomfort EXAM: CHEST - 2 VIEW COMPARISON:  05/08/2020 FINDINGS: The heart size and mediastinal contours are within normal limits. Both lungs are clear. The visualized skeletal structures are unremarkable. IMPRESSION: No active cardiopulmonary disease. Electronically Signed   By: Jasmine Pang M.D.   On: 02/21/2021 19:37      PROCEDURES:  Critical Care performed: No  Procedures    IMPRESSION / MDM / ASSESSMENT AND PLAN / ED COURSE  I reviewed the triage vital signs and the nursing notes.  21 y.o. male with a history of anxiety and panic attacks who presents for evaluation of fatigue since having COVID in May 2022.  Patient has had some chest pain and shortness of breath with exercise worse most recently.  No family history of sudden death.  He is otherwise extremely well-appearing in no distress with normal vital signs, normal physical exam  Ddx: Long COVID syndrome versus lung inflammatory changes from COVID versus dysrhythmia versus myocarditis versus anemia versus deconditioning versus anxiety   Plan: EKG, troponin, chest x-ray, basic labs, thyroid studies, COVID and flu swab.   MEDICATIONS GIVEN IN ED: Medications - No data to display   ED COURSE: EKG with no signs of dysrhythmias.  Troponin x2 negative.  Thyroid studies normal.  COVID and flu negative.  No signs of anemia, sepsis, dehydration, electrolyte derangements, or AKI.  Chest x-ray with no signs of cardiomegaly or pneumonia.  Discussed follow-up with cardiology and a referral was placed for Holter monitoring during exercise.  Discussed my standard return precautions.  Admission was considered but since patient is asymptomatic with a negative work-up I felt that this could be done as an outpatient.     Consults:  None   EMR reviewed including last visit with patient's mental health provider for anxiety disorder from 10 days ago      FINAL CLINICAL IMPRESSION(S) / ED DIAGNOSES   Final diagnoses:  Fatigue, unspecified type  Chest tightness     Rx / DC Orders   ED Discharge Orders          Ordered    Ambulatory referral to Cardiology       Comments: Chest tightness and SOB with exercise post covid   02/22/21 0201             Note:  This document was prepared using Dragon voice recognition software and may include unintentional  dictation errors.   Don Perking, [place], MD 02/22/21 [phone]

## 2021-02-22 NOTE — Discharge Instructions (Signed)

## 2021-03-05 ENCOUNTER — Encounter: Payer: Self-pay | Admitting: Internal Medicine

## 2021-03-05 ENCOUNTER — Ambulatory Visit (INDEPENDENT_AMBULATORY_CARE_PROVIDER_SITE_OTHER): Payer: 59 | Admitting: Internal Medicine

## 2021-03-05 ENCOUNTER — Other Ambulatory Visit: Payer: Self-pay

## 2021-03-05 VITALS — BP 114/70 | HR 87 | Ht 73.0 in | Wt 163.0 lb

## 2021-03-05 DIAGNOSIS — R0602 Shortness of breath: Secondary | ICD-10-CM

## 2021-03-05 DIAGNOSIS — R079 Chest pain, unspecified: Secondary | ICD-10-CM | POA: Diagnosis not present

## 2021-03-05 MED ORDER — FAMOTIDINE 20 MG PO TABS: 20.0000 mg | ORAL_TABLET | Freq: Two times a day (BID) | ORAL | Status: DC

## 2021-03-05 NOTE — Patient Instructions (Signed)
Medication Instructions:   Your physician has recommended you make the following change in your medication:   START Famotidine (Pepcid) 20 mg TWICE daily (over-the-counter)  *If you need a refill on your cardiac medications before your next appointment, please call your pharmacy*   Lab Work:  None ordered  Testing/Procedures:  Your physician has requested that you have an echocardiogram. Echocardiography is a painless test that uses sound waves to create images of your heart. It provides your doctor with information about the size and shape of your heart and how well your hearts chambers and valves are working. This procedure takes approximately one hour. There are no restrictions for this procedure.   Follow-Up: At [facility], you and your health needs are our priority.  As part of our continuing mission to provide you with exceptional heart care, we have created designated Provider Care Teams.  These Care Teams include your primary Cardiologist (physician) and Advanced Practice Providers (APPs -  Physician Assistants and Nurse Practitioners) who all work together to provide you with the care you need, when you need it.  We recommend signing up for the patient portal called "MyChart".  Sign up information is provided on this After Visit Summary.  MyChart is used to connect with patients for Virtual Visits (Telemedicine).  Patients are able to view lab/test results, encounter notes, upcoming appointments, etc.  Non-urgent messages can be sent to your provider as well.   To learn more about what you can do with MyChart, go to [email or web address].    Your next appointment:    Follow up after echo  The format for your next appointment:   In Person  Provider:   You may see Dr. Cristal Deer End or one of the following Advanced Practice Providers on your designated Care Team:   Nicolasa Ducking, NP Eula Listen, PA-C Cadence Fransico Michael, [place]

## 2021-03-05 NOTE — Progress Notes (Signed)
New Outpatient Visit Date: 03/05/2021  Referring Provider: Rudene Re, [address],  [place] [address]  Chief Complaint: Chest pain and fatigue  HPI:  Christian Moore is a 21 y.o. male who is being seen today for the evaluation of chest tightness and fatigue at the request of Alfred Levins, [place], MD. He has a history of anxiety/panic attacks.  He presented to the [facility] emergency department on 02/21/2021 complaining of increased fatigue.  He notes that he has not been able to exercise as much at the gym as he experiences some chest tightness and shortness of breath with activity.  He was concerned about the potential for myocarditis, having had COVID-19 in 08/2020.  Today, Christian Moore reports that he has not felt all that well since he had COVID-19 the second time in July, 2022 (previously had in 2020).  He reports a "weird feeling" in the left side of his chest.  He describes it as a tightness, almost as if there is a clenched fist in his chest.  It is present all the time and is not positional or related to particular activities including exertion.  At times, he feels like it is hard to catch his breath when he has the chest pain.  However, he feels like his pulses more noticeable than usual when he exercises.  His heart rate also jumps up to 160 bpm after only a few minutes of modest activity.  He notes intermittent orthostatic lightheadedness, which has been present for quite some time.  He has not had any orthopnea, PND, or edema.  Christian Moore reports some degree of exercise intolerance going back to high school.  He tried participating in cross-country running but was unable to run more than 3 miles at a time because he would develop dry heaving and vomiting.  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  He has a prescription for propranolol to be used as needed for anxiety, which he has never  taken.  Other than an EKG at the time of the aforementioned cardiology visit as an adolescent, he has never undergone cardiac testing.  He is a Paramedic at [employer], [employer].  --------------------------------------------------------------------------------------------------  Cardiovascular History & Procedures: Cardiovascular Problems: Atypical chest pain Shortness of breath  Risk Factors: Male gender  Cath/PCI: None  CV Surgery: None  EP Procedures and Devices: None  Non-Invasive Evaluation(s): None  Recent CV Pertinent Labs: Lab Results  Component Value Date   K 3.6 02/21/2021   BUN 13 02/21/2021   CREATININE 0.86 02/21/2021    --------------------------------------------------------------------------------------------------  Past Medical History:  Diagnosis Date   Anxiety    Panic attacks     Past Surgical History:  Procedure Laterality Date   CYSTOSCOPY     LAPAROSCOPIC APPENDECTOMY N/A 10/24/2018   Procedure: APPENDECTOMY LAPAROSCOPIC;  Surgeon: Olean Ree, MD;  Location: [facility] ORS;  Service: General;  Laterality: N/A;   TONSILLECTOMY     UPPER GASTROINTESTINAL ENDOSCOPY      Current Meds  Medication Sig   buPROPion (WELLBUTRIN) 75 MG tablet TAKE 1 TABLET (75 MG TOTAL) BY MOUTH IN THE MORNING.   famotidine (PEPCID) 20 MG tablet Take 1 tablet (20 mg total) by mouth 2 (two) times daily.   propranolol (INDERAL) 10 MG tablet Take 1 tablet (10 mg total) by mouth daily as needed. For severe anxiety attacks , limit use   sertraline (ZOLOFT) 100 MG tablet Take  1 tablet (100 mg total) by mouth daily. Take daily at 8 AM    Allergies: Patient has no known allergies.  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  --------------------------------------------------------------------------------------------------  Physical Exam: BP 114/70 (BP Location: Right Arm, Patient Position: Sitting, Cuff Size: Normal)    Pulse 87    Ht 6\' 1"  (1.854 m)    Wt 163 lb (73.9 kg)    SpO2 98%    BMI 21.51 kg/m   General: NAD. HEENT: No conjunctival pallor or scleral icterus. Facemask in place. Neck: Supple without lymphadenopathy, thyromegaly, JVD, or HJR. No carotid bruit. Lungs: Normal work of breathing. Clear to auscultation bilaterally without wheezes or crackles. Heart: Regular rate and rhythm without murmurs, rubs, or gallops. Non-displaced PMI. Abd: Bowel sounds present. Soft, NT/ND without hepatosplenomegaly Ext: No lower extremity edema. Radial, PT, and DP pulses are 2+ bilaterally Skin: Warm and dry without rash. Neuro: CNIII-XII intact. Strength and fine-touch sensation intact in upper and lower extremities bilaterally. Psych: Normal mood and affect.  EKG: Normal sinus rhythm with sinus arrhythmia.  No abnormality identified.  Lab Results  Component Value Date   WBC 7.8 02/21/2021   HGB 16.1 02/21/2021   HCT 48.0 02/21/2021   MCV 82.8 02/21/2021   PLT 252 02/21/2021    Lab Results  Component Value Date   NA 137 02/21/2021   K 3.6 02/21/2021   CL 100 02/21/2021   CO2 28 02/21/2021   BUN 13 02/21/2021   CREATININE 0.86 02/21/2021   GLUCOSE 100 (H) 02/21/2021   ALT 14 10/23/2018    No results found for: CHOL, HDL, LDLCALC, LDLDIRECT, TRIG, CHOLHDL   --------------------------------------------------------------------------------------------------  ASSESSMENT AND PLAN: Chest pain and shortness of breath: Christian Moore reports episodic chest  pain and shortness of breath as well as increasing fatigue over the last 6 months, beginning around the time of his second COVID-19 infection.  His EKG and physical examination today are unremarkable.  Recent ED evaluation was also unrevealing, with negative high-sensitivity troponin I x2.  Time course and objective data are not consistent with an acute viral myocarditis.  We have agreed to obtain an echocardiogram to assess for possible structural abnormalities.  I have encouraged Christian Moore to begin taking famotidine 20 mg twice daily for treatment of possible GERD underlying his atypical chest pain.  Follow-up: Return to clinic after completion of echocardiogram.  Nelva Bush, MD 03/07/2021 1:48 PM

## 2021-03-07 ENCOUNTER — Encounter: Payer: Self-pay | Admitting: Internal Medicine

## 2021-03-07 DIAGNOSIS — R0602 Shortness of breath: Secondary | ICD-10-CM | POA: Insufficient documentation

## 2021-03-07 DIAGNOSIS — R079 Chest pain, unspecified: Secondary | ICD-10-CM | POA: Insufficient documentation

## 2021-03-25 ENCOUNTER — Ambulatory Visit (INDEPENDENT_AMBULATORY_CARE_PROVIDER_SITE_OTHER): Payer: 59

## 2021-03-25 ENCOUNTER — Other Ambulatory Visit: Payer: Self-pay

## 2021-03-25 DIAGNOSIS — R079 Chest pain, unspecified: Secondary | ICD-10-CM | POA: Diagnosis not present

## 2021-03-25 LAB — ECHOCARDIOGRAM COMPLETE
AR max vel: 3.32 cm2
AV Area VTI: 3.13 cm2
AV Area mean vel: 3.05 cm2
AV Mean grad: 2 mmHg
AV Peak grad: 3.4 mmHg
Ao pk vel: 0.92 m/s
Area-P 1/2: 3.39 cm2
Calc EF: 49.6 %
S' Lateral: 3.6 cm
Single Plane A2C EF: 52.9 %
Single Plane A4C EF: 46.5 %

## 2021-03-30 ENCOUNTER — Other Ambulatory Visit: Payer: Self-pay

## 2021-03-30 ENCOUNTER — Ambulatory Visit (INDEPENDENT_AMBULATORY_CARE_PROVIDER_SITE_OTHER): Payer: 59 | Admitting: Medical

## 2021-03-30 ENCOUNTER — Encounter: Payer: Self-pay | Admitting: Medical

## 2021-03-30 VITALS — BP 110/70 | HR 85 | Ht 74.0 in | Wt 164.0 lb

## 2021-03-30 DIAGNOSIS — R079 Chest pain, unspecified: Secondary | ICD-10-CM | POA: Diagnosis not present

## 2021-03-30 DIAGNOSIS — R0602 Shortness of breath: Secondary | ICD-10-CM | POA: Diagnosis not present

## 2021-03-30 NOTE — Patient Instructions (Signed)
Medication Instructions:   Your physician recommends that you continue on your current medications as directed. Please refer to the Current Medication list given to you today.   *If you need a refill on your cardiac medications before your next appointment, please call your pharmacy*   Lab Work: None ordered  If you have labs (blood work) drawn today and your tests are completely normal, you will receive your results only by: MyChart Message (if you have MyChart) OR A paper copy in the mail If you have any lab test that is abnormal or we need to change your treatment, we will call you to review the results.   Testing/Procedures:  You are scheduled for a Cardiopulmonary Exercise (CPX) Test @ [facility] on: March 14th, 2023 @ 11 AM.   Your physician has recommended that you have a cardiopulmonary stress test (CPX). CPX testing is a non-invasive measurement of heart and lung function. This test combines measurements of you ventilation, respiratory gas exchange in the lungs, electrocardiogram (EKG), blood pressure and physical response before, during, and following an exercise protocol  Expect to be in the lab for 2 hours.   Please arrivee @ [address] (main entrance C). Free valet parking is available. You will proceed to admitting 30 minutes prior to your scheduled appointment. You may be asked to rescheduled if you arrive 20 minutes or more after your scheduled appointment time.   After checking in through admitting, proceed to [facility] waiting room.   Instructions for day of the test: - refrain from ingesting a heavy meal, alcohol, or caffeine or using tobacco products within 2 hours of the test - please DO NOT FAST for more than 8 hours - you may have other non-alcoholic, caffeine-free beverages, a light snack (crackers, piece of fruit, toast, bagel, etc) up to your appointment time  - avoid significant exertion or exercise within 24 hours of your  test - be prepared to exercise & sweat - your clothing should permit freedom of movement and include running/walking shoes  - this evaluation may be fatiguing and you may wish to have someone accompany you to the assessment and drive you home afterward  - bring a list of medications with you (including dose, frequency) - take all medications as prescribed unless directed otherwise by your physician    General description of test:  A brief lung function test will be performed. This will involve you taking deep breaths and blowing out hard & fast through your mouth. During these, a nose clip will be on your nose and you will be breathing through a breathing device.   For the exercise portion of the test, you will be walking on a motorized treadmill or riding a stationary bike to your maximal effort or until symptoms such as chest pain, shortness of breath, leg pain or dizziness limit your exercise. Like the lung function test, you will be breathing in and out of a breathing device through your mouth with the nose clip. Your heart rate, ECG, blood pressure, oxygen saturation, breathing rate & depth, amount of oxygen you consume and amount of carbon dioxide you produce will be measured and monitored throughout the test.   If you need to cancel or reschedule your test, call [phone].     Follow-Up: At [facility], you and your health needs are our priority.  As part of our continuing mission to provide you with exceptional heart care, we have created designated Provider Care Teams.  These Care Teams include your primary Cardiologist (physician) and Advanced Practice Providers (APPs -  Physician Assistants and Nurse Practitioners) who all work together to provide you with the care you need, when you need it.  We recommend signing up for the patient portal called "MyChart".  Sign up information is provided on this After Visit Summary.  MyChart is used to connect with patients for Virtual Visits  (Telemedicine).  Patients are able to view lab/test results, encounter notes, upcoming appointments, etc.  Non-urgent messages can be sent to your provider as well.   To learn more about what you can do with MyChart, go to [email or web address].    Your next appointment:   6-8 week(s)  The format for your next appointment:   In Person  Provider:   You may see Yvonne Kendall, MD or one of the following Advanced Practice Providers on your designated Care Team:   Nicolasa Ducking, NP Eula Listen, PA-C Cadence Fransico Michael, [place]   Other Instructions N/A

## 2021-03-30 NOTE — Progress Notes (Signed)
Cardiology Office Note:    Date:  03/30/2021   ID:  Christian Moore, DOB September 16, 2000, MRN [id number]  PCP:  Patient, No Pcp Per (Inactive)  [facility] Cardiologist:  None  [facility] Electrophysiologist:  None   Referring MD: No ref. provider found   Chief Complaint: F/u echo  History of Present Illness:    Christian Moore is a 21 y.o. male with a hx of COVID-19, anxiety/panic attacks who presents for follow-up.   He presented to the [facility] emergency department on 02/21/2021 complaining of increased fatigue.  He notes that he has not been able to exercise as much at the gym as he experiences some chest tightness and shortness of breath with activity.  He was concerned about the potential for myocarditis, having had COVID-19 in 08/2020.  Last seen 03/05/21 and reported chest discomfort since COVID-19. Also had SOB and increasing fatigue since COVID-19. Echo was ordered. Echo showed LVEF 50-55%, otherwise unremarkable.  Today, the patient reports he is having the same symptoms as the prior visit. Always feels tired, overall fatigue. Also feels some chest tightness. Has not exercised, has been taking it easy. Chest tightness is worse with exertion, however can be dull at times. He does not smoke. Moderate alcohol use. Family history of MI in 50-60s. He does not have PCP.   Past Medical History:  Diagnosis Date   Anxiety    Panic attacks     Past Surgical History:  Procedure Laterality Date   CYSTOSCOPY     LAPAROSCOPIC APPENDECTOMY N/A 10/24/2018   Procedure: APPENDECTOMY LAPAROSCOPIC;  Surgeon: Henrene Dodge, MD;  Location: [facility] ORS;  Service: General;  Laterality: N/A;   TONSILLECTOMY     UPPER GASTROINTESTINAL ENDOSCOPY      Current Medications: Current Meds  Medication Sig   buPROPion (WELLBUTRIN) 75 MG tablet TAKE 1 TABLET (75 MG TOTAL) BY MOUTH IN THE MORNING.   propranolol (INDERAL) 10 MG tablet Take 1 tablet (10 mg total) by mouth daily as needed. For severe anxiety attacks  , limit use   sertraline (ZOLOFT) 100 MG tablet Take 1 tablet (100 mg total) by mouth daily. Take daily at 8 AM     Allergies:   Patient has no known allergies.   Social History   Socioeconomic History   Marital status: Single    Spouse name: Not on file   Number of children: 0   Years of education: Not on file   Highest education level: Not on file  Occupational History   Not on file  Tobacco Use   Smoking status: Never   Smokeless tobacco: Never  Vaping Use   Vaping Use: Former  Substance and Sexual Activity   Alcohol use: Yes    Alcohol/week: 1.0 standard drink    Types: 1 Cans of beer per week    Comment: currently drinks 1 beer per day; 1-2 years ago (was binge drinking)   Drug use: Never   Sexual activity: Not Currently  Other Topics Concern   Not on file  Social History Narrative   Not on file   Social Determinants of Health   Financial Resource Strain: Not on file  Food Insecurity: Not on file  Transportation Needs: Not on file  Physical Activity: Not on file  Stress: Not on file  Social Connections: Not on file     Family History: The patient's family history includes Anxiety disorder in his mother; Heart attack in his maternal grandmother; Hyperlipidemia in his father; Hypertension in his father.  ROS:   Please see the history of present illness.     All other systems reviewed and are negative.  EKGs/Labs/Other Studies Reviewed:    The following studies were reviewed today:  Echo 03/2021 1. Left ventricular ejection fraction, by estimation, is 50 to 55%. The  left ventricle has low normal function. The left ventricle has no regional  wall motion abnormalities. Left ventricular diastolic parameters were  normal.   2. Right ventricular systolic function is normal. The right ventricular  size is normal.   3. The mitral valve is normal in structure. No evidence of mitral valve  regurgitation.   4. The aortic valve is tricuspid. Aortic valve  regurgitation is not  visualized.   5. The inferior vena cava is normal in size with greater than 50%  respiratory variability, suggesting right atrial pressure of 3 mmHg.   EKG:  EKG is not ordered today.    Recent Labs: 02/21/2021: BUN 13; Creatinine, Ser 0.86; Hemoglobin 16.1; Platelets 252; Potassium 3.6; Sodium 137; TSH 1.766  Recent Lipid Panel No results found for: CHOL, TRIG, HDL, CHOLHDL, VLDL, LDLCALC, LDLDIRECT    Physical Exam:    VS:  BP 110/70 (BP Location: Left Arm, Patient Position: Sitting, Cuff Size: Normal)    Pulse 85    Ht 6\' 2"  (1.88 m)    Wt 164 lb (74.4 kg)    SpO2 98%    BMI 21.06 kg/m     Wt Readings from Last 3 Encounters:  03/30/21 164 lb (74.4 kg)  03/05/21 163 lb (73.9 kg)  02/21/21 160 lb (72.6 kg)     GEN:  Well nourished, well developed in no acute distress HEENT: Normal NECK: No JVD; No carotid bruits LYMPHATICS: No lymphadenopathy CARDIAC: RRR, no murmurs, rubs, gallops RESPIRATORY:  Clear to auscultation without rales, wheezing or rhonchi  ABDOMEN: Soft, non-tender, non-distended MUSCULOSKELETAL:  No edema; No deformity  SKIN: Warm and dry NEUROLOGIC:  Alert and oriented x 3 PSYCHIATRIC:  Normal affect   ASSESSMENT:    1. Chest pain, unspecified type   2. Shortness of breath    PLAN:    In order of problems listed above:  Chest discomfort and SOB Echo showed normal LVEF, overall unremarkable. Patient rpeorts persistent fatigue and exertional SOB and chest discomfort. He would be interested in pursuing further testing, I will order cardiopulmonary exercise test. Recommend he establish with PCP to explore other non-cardiac issues.   Disposition: Follow up in 6-8 week(s) with MD/APP    Riata Ikeda 02/23/21, PA-C  03/30/2021 3:12 PM    [facility]

## 2021-04-01 ENCOUNTER — Encounter: Payer: Self-pay | Admitting: Psychiatry

## 2021-04-01 ENCOUNTER — Ambulatory Visit (INDEPENDENT_AMBULATORY_CARE_PROVIDER_SITE_OTHER): Payer: 59 | Admitting: Psychiatry

## 2021-04-01 ENCOUNTER — Other Ambulatory Visit: Payer: Self-pay

## 2021-04-01 VITALS — BP 112/73 | HR 81 | Temp 98.5°F | Wt 165.0 lb

## 2021-04-01 DIAGNOSIS — F410 Panic disorder [episodic paroxysmal anxiety] without agoraphobia: Secondary | ICD-10-CM

## 2021-04-01 DIAGNOSIS — F411 Generalized anxiety disorder: Secondary | ICD-10-CM | POA: Diagnosis not present

## 2021-04-01 DIAGNOSIS — F3289 Other specified depressive episodes: Secondary | ICD-10-CM

## 2021-04-01 MED ORDER — BUPROPION HCL ER (XL) 150 MG PO TB24: 150.0000 mg | ORAL_TABLET | Freq: Every day | ORAL | 0 refills | Status: DC

## 2021-04-01 NOTE — Progress Notes (Signed)
[place] MD OP Progress Note  04/01/2021 3:23 PM Christian Moore  MRN:  [id number]  Chief Complaint:  Chief Complaint  Patient presents with   Follow-up: 21 year old Caucasian male, currently a junior at [employer], has a history of GAD, panic attacks, presented for medication management.   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  Patient after his last visit with writer had to go to the emergency department on 02/21/2021 with increased fatigue, chest pain.  Patient thereafter was evaluated by cardiology-Dr. End - 03/05/2021, Ms.Kathlen Mody 03/30/21, patient had an echocardiogram-LVEF 50 to 55%, otherwise unremarkable.  Patient is currently scheduled for a stress test.  Patient continues to have fatigue, gradually getting worse since his COVID-19 infection in July 2023.  Reports he has trouble going to the gym since he gets tired too quickly.  Patient also reports excessive sleepiness even after staying in bed for a whole night.  Patient reports he sleeps through the night, denies any restlessness.  However he reports he dozes off during the day, unknown if this is due to the fatigue.  Has never had a sleep study done in the past.  Does have anxiety about his current fatigue .  Currently compliant on his medications.  Denies any significant sadness or hopelessness.  Denies any side effects to medications.  Reports appetite is fair.  Uses alcohol in moderation on weekends.  Reports he is currently doing fairly well in his classes.  He missed one of his classes however has been attending otherwise.  Agreeable to dosage increase of Wellbutrin, aware of side effects.  Denies any other concerns today.    Visit Diagnosis:    ICD-10-CM   1. GAD (generalized anxiety disorder)  F41.1     2. 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  Past trials of Zoloft.  Past Medical History:  Past Medical History:  Diagnosis Date   Anxiety    Panic attacks     Past Surgical History:  Procedure Laterality Date   CYSTOSCOPY     LAPAROSCOPIC APPENDECTOMY N/A 10/24/2018   Procedure: APPENDECTOMY LAPAROSCOPIC;  Surgeon: Olean Ree, MD;  Location: [facility] ORS;  Service: General;  Laterality: N/A;   TONSILLECTOMY     UPPER GASTROINTESTINAL ENDOSCOPY      Family Psychiatric History: Reviewed family psychiatric history from progress note on 12/18/2020.  Family History:  Family History  Problem Relation Age of Onset   Anxiety disorder Mother    Hyperlipidemia Father    Hypertension Father    Heart attack Maternal Grandmother     Social History: Reviewed social history from progress note on 12/18/2020. Social History   Socioeconomic History   Marital status: Single    Spouse name: Not on file   Number of children: 0   Years of education: Not on file   Highest education level: Not on file  Occupational History   Not on file  Tobacco Use   Smoking status: Never   Smokeless tobacco: Never  Vaping Use   Vaping Use: Former  Substance and Sexual Activity   Alcohol use: Yes    Alcohol/week: 1.0 standard drink    Types: 1 Cans of beer per  week    Comment: currently drinks 1 beer per day; 1-2 years ago (was binge drinking)   Drug use: Never   Sexual activity: Not Currently  Other Topics Concern   Not on file  Social History Narrative   Not on file   Social Determinants of Health   Financial Resource Strain: Not on file  Food Insecurity: Not on file  Transportation Needs: Not on file  Physical Activity: Not on file  Stress: Not on file  Social Connections: Not on file    Allergies: No Known Allergies  Metabolic Disorder Labs: No results found for: HGBA1C, MPG No  results found for: PROLACTIN No results found for: CHOL, TRIG, HDL, CHOLHDL, VLDL, LDLCALC Lab Results  Component Value Date   TSH 1.766 02/21/2021   TSH 2.924 12/18/2020    Therapeutic Level Labs: No results found for: LITHIUM No results found for: VALPROATE No components found for:  CBMZ  Current Medications: Current Outpatient Medications  Medication Sig Dispense Refill   buPROPion (WELLBUTRIN XL) 150 MG 24 hr tablet Take 1 tablet (150 mg total) by mouth daily. Dose increase 30 tablet 0   propranolol (INDERAL) 10 MG tablet Take 1 tablet (10 mg total) by mouth daily as needed. For severe anxiety attacks , limit use 30 tablet 1   sertraline (ZOLOFT) 100 MG tablet Take 1 tablet (100 mg total) by mouth daily. Take daily at 8 AM 90 tablet 0   No current facility-administered medications for this visit.     Musculoskeletal: Strength & Muscle Tone: within normal limits Gait & Station: normal Patient leans: N/A  Psychiatric Specialty Exam: Review of Systems  Constitutional:  Positive for fatigue.  Psychiatric/Behavioral:  Positive for dysphoric mood and sleep disturbance. The patient is nervous/anxious.   All other systems reviewed and are negative.  Blood pressure 112/73, pulse 81, temperature 98.5 F (36.9 C), temperature source Temporal, weight 165 lb (74.8 kg).Body mass index is 21.18 kg/m.  General Appearance: Casual  Eye Contact:  Fair  Speech:  Clear and Coherent  Volume:  Normal  Mood:  Anxious and Depressed  Affect:  Appropriate  Thought Process:  Goal Directed and Descriptions of Associations: Intact  Orientation:  Full (Time, Place, and Person)  Thought Content: Logical   Suicidal Thoughts:  No  Homicidal Thoughts:  No  Memory:  Immediate;   Fair Recent;   Fair Remote;   Fair  Judgement:  Fair  Insight:  Fair  Psychomotor Activity:  Normal  Concentration:  Concentration: Fair and Attention Span: Fair  Recall:  [employer] of Knowledge: Fair  Language:  Fair  Akathisia:  No  Handed:  Right  AIMS (if indicated): done, 0  Assets:  Communication Skills Desire for Improvement Housing Social Support  ADL's:  Intact  Cognition: WNL  Sleep:   Excessive   Screenings: GAD-7    Flowsheet Row Office Visit from 02/12/2021 in [place] Office Visit from 01/01/2021 in [place] Visit from 12/18/2020 in [place]  Total GAD-7 Score 5 6 6       PHQ2-9    [place] Visit from 04/01/2021 in [place] Visit from 02/12/2021 in [place] Visit from 01/01/2021 in [place] Visit from 12/18/2020 in [place]  PHQ-2 Total Score 1 2 1 1   PHQ-9 Total Score 7 7 2 10       [place] Office Visit  from 04/01/2021 in [place] ED from 02/21/2021 in [place] Office Visit from 02/12/2021 in [place]  C-SSRS RISK CATEGORY No Risk No Risk No Risk        Assessment and Plan: Christian Moore is a 21 year old Caucasian male, junior at [employer], 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  Patient will benefit from the following plan.  Plan GAD-improving Zoloft 100 mg p.o. daily-reduced dosage Continue CBT at [facility]  Panic attacks-improving Continue propranolol 10 mg p.o. daily as needed Zoloft 100 mg p.o. daily  Other specified depressive episodes-unstable Increase Wellbutrin XL to 150 mg p.o. daily in the morning.  This may also help with excessive sleepiness and fatigue. Continue Zoloft 100 mg p.o. daily  Hypersomnia-unstable Epworth sleep scale - 8. I have increased Wellbutrin  dosage. Patient may benefit from a sleep study.  Patient to discuss with primary care provider or cardiology. Also discussed to consider referral to [place] clinic.  I have reviewed notes per cardiology-Dr. End as well as Ms.Furth as noted above.  Follow-up in clinic in 2 weeks or sooner if needed.  Collaboration of Care: Collaboration of Care: Other patient encouraged to follow up with cardiology.  We will coordinate care.  Patient/Guardian was advised Release of Information must be obtained prior to any record release in order to collaborate their care with an outside provider. Patient/Guardian was advised if they have not already done so to contact the registration department to sign all necessary forms in order for [place] to release information regarding their care.   Consent: Patient/Guardian gives verbal consent for treatment and assignment of benefits for services provided during this visit. Patient/Guardian expressed understanding and agreed to proceed.   This note was generated in part or whole with voice recognition software. Voice recognition is usually quite accurate but there are transcription errors that can and very often do occur. I apologize for any typographical errors that were not detected and corrected.      Ursula Alert, MD 04/01/2021, 3:23 PM

## 2021-04-13 ENCOUNTER — Telehealth: Payer: Self-pay | Admitting: Medical

## 2021-04-13 NOTE — Telephone Encounter (Signed)
Per Doy Hutching, RN- call received from [place] in scheduling at [facility] stating the patient had called our office earlier with questions as to where to go for his CPX text that was scheduled for 04/14/21. ? ?Britta Mccreedy reached out to [employer] for clarification- Coleen asked if I could contact the patient as she is currently in clinic. ? ?Clarified with the CHF clinic in [place] that the patient will need to arrive at the Heart & Vascular Entrance (Entrance "C") at [facility] in [place]. ?Valet parking is available or they can park in the parking deck (code for March is [phone]). ? ?I have called and notified the patient of the above information and advised that if he has any further questions regarding directions, he may call the CHF clinic at [phone] for clarification as to where to go. ? ?The patient voices understanding and is agreeable. ?He advised to did have to call and reschedule his test from tomorrow to 04/28/21. ?He was under the impressive initially that his test was going to be at [facility]. ?I apologized to the patient if that was not made clear to him. ?I have asked him to refer back to his AVS from his visit on 03/07/21 for his full instructions.  ? ?The patient voices understanding of the above and is agreeable. ? ?He was appreciative of the call back.  ? ? ?

## 2021-04-14 ENCOUNTER — Encounter (HOSPITAL_COMMUNITY): Payer: 59

## 2021-04-23 ENCOUNTER — Other Ambulatory Visit: Payer: Self-pay | Admitting: Psychiatry

## 2021-04-23 ENCOUNTER — Telehealth (INDEPENDENT_AMBULATORY_CARE_PROVIDER_SITE_OTHER): Payer: 59 | Admitting: Psychiatry

## 2021-04-23 ENCOUNTER — Other Ambulatory Visit: Payer: Self-pay

## 2021-04-23 ENCOUNTER — Encounter: Payer: Self-pay | Admitting: Psychiatry

## 2021-04-23 DIAGNOSIS — F3289 Other specified depressive episodes: Secondary | ICD-10-CM | POA: Diagnosis not present

## 2021-04-23 DIAGNOSIS — F411 Generalized anxiety disorder: Secondary | ICD-10-CM | POA: Diagnosis not present

## 2021-04-23 DIAGNOSIS — F410 Panic disorder [episodic paroxysmal anxiety] without agoraphobia: Secondary | ICD-10-CM | POA: Diagnosis not present

## 2021-04-23 MED ORDER — SERTRALINE HCL 100 MG PO TABS: 100.0000 mg | ORAL_TABLET | Freq: Every day | ORAL | 0 refills | Status: DC

## 2021-04-23 MED ORDER — BUPROPION HCL ER (XL) 150 MG PO TB24: 150.0000 mg | ORAL_TABLET | Freq: Every day | ORAL | 0 refills | Status: DC

## 2021-04-23 NOTE — Progress Notes (Signed)
Virtual Visit via Video Note ? ?I connected with Christian Moore on 04/23/21 at  2:40 PM EDT by a video enabled telemedicine application and verified that I am speaking with the correct person using two identifiers. ? ?Location ?Provider Location : [facility] ?Patient Location : Home ? ?Participants: Patient , Provider ? ?  ?I discussed the limitations of evaluation and management by telemedicine and the availability of in person appointments. The patient expressed understanding and agreed to proceed. ? ?  ?I discussed the assessment and treatment plan with the patient. The patient was provided an opportunity to ask questions and all were answered. The patient agreed with the plan and demonstrated an understanding of the instructions. ?  ?The patient was advised to call back or seek an in-person evaluation if the symptoms worsen or if the condition fails to improve as anticipated. ? ? ? ?[facility] MD OP Progress Note ? ?04/23/2021 5:22 PM ?Christian Moore  ?MRN:  [id number] ? ?Chief Complaint:  ?Chief Complaint  ?Patient presents with  ? Follow-up: 21 year old Caucasian male, currently a junior at [employer], has a history of GAD, panic attacks, presented for medication management.  ? 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 ? ?Patient today reports he is currently compliant on the Wellbutrin extended release 150 mg.  Since being on this higher dosage she has not noticed any side effects.  Denies any significant anxiety, irritability, anger issues. ? ?Patient reports his anxiety symptoms are more under control on the current medication regimen. ? ?Patient however reports he continues to struggle with excessive sleepiness during the day.  Patient reports he is aware that he needs to work on his sleep hygiene.  He reports he is planning to try to go to bed earlier.  He also reports he  has been struggling in some classes especially in the morning since he can barely keep his eyes open in the morning since these classes are early in the a.m. ? ?Patient has not had a sleep study done.  Does report some snoring.  Agrees to have a discussion with primary care provider.  He also has a cardiology appointment coming up. ? ?Patient denies any suicidality, homicidality or perceptual disturbances. ? ?Patient denies any other concerns today. ? ?Visit Diagnosis:  ?  ICD-10-CM   ?1. GAD (generalized anxiety disorder)  F41.1 sertraline (ZOLOFT) 100 MG tablet  ?  ?2. Panic attacks  F41.0   ?  ?3. 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  buPROPion (WELLBUTRIN XL) 150 MG 24 hr tablet  ?  ? ? ?Past Psychiatric History: Reviewed past psychiatric history from progress note on 12/18/2020.  Past trials of Zoloft. ? ?Past Medical History:  ?Past Medical History:  ?Diagnosis Date  ? Anxiety   ? Panic attacks   ?  ?Past Surgical History:  ?Procedure Laterality Date  ? CYSTOSCOPY    ? LAPAROSCOPIC APPENDECTOMY N/A 10/24/2018  ? Procedure: APPENDECTOMY LAPAROSCOPIC;  Surgeon: Henrene Dodge, MD;  Location: [facility] ORS;  Service: General;  Laterality: N/A;  ? TONSILLECTOMY    ? UPPER GASTROINTESTINAL ENDOSCOPY    ? ? ?Family Psychiatric History: Reviewed family psychiatric history from progress note on 12/18/2020. ? ?Family History:  ?Family History  ?Problem Relation Age of Onset  ? Anxiety disorder Mother   ? Hyperlipidemia Father   ? Hypertension Father   ?  Heart attack Maternal Grandmother   ? ? ?Social History: Reviewed social history from progress note on 12/18/2020. ?Social History  ? ?Socioeconomic History  ? Marital status: Single  ?  Spouse name: Not on file  ? Number of children: 0  ? Years of education: Not on file  ? Highest education level: Not on file  ?Occupational History  ? Not on file  ?Tobacco Use  ? Smoking status: Never  ? Smokeless tobacco: Never  ?Vaping Use  ?  Vaping Use: Former  ?Substance and Sexual Activity  ? Alcohol use: Yes  ?  Alcohol/week: 1.0 standard drink  ?  Types: 1 Cans of beer per week  ?  Comment: currently drinks 1 beer per day; 1-2 years ago (was binge drinking)  ? Drug use: Never  ? Sexual activity: Not Currently  ?Other Topics Concern  ? Not on file  ?Social History Narrative  ? Not on file  ? ?Social Determinants of Health  ? ?Financial Resource Strain: Not on file  ?Food Insecurity: Not on file  ?Transportation Needs: Not on file  ?Physical Activity: Not on file  ?Stress: Not on file  ?Social Connections: Not on file  ? ? ?Allergies: No Known Allergies ? ?Metabolic Disorder Labs: ?No results found for: HGBA1C, MPG ?No results found for: PROLACTIN ?No results found for: CHOL, TRIG, HDL, CHOLHDL, VLDL, LDLCALC ?Lab Results  ?Component Value Date  ? TSH 1.766 02/21/2021  ? TSH 2.924 12/18/2020  ? ? ?Therapeutic Level Labs: ?No results found for: LITHIUM ?No results found for: VALPROATE ?No components found for:  CBMZ ? ?Current Medications: ?Current Outpatient Medications  ?Medication Sig Dispense Refill  ? buPROPion (WELLBUTRIN XL) 150 MG 24 hr tablet TAKE 1 TABLET (150 MG TOTAL) BY MOUTH DAILY. DOSE INCREASE 90 tablet 1  ? buPROPion (WELLBUTRIN XL) 150 MG 24 hr tablet Take 1 tablet (150 mg total) by mouth daily. Dose increase 90 tablet 0  ? propranolol (INDERAL) 10 MG tablet Take 1 tablet (10 mg total) by mouth daily as needed. For severe anxiety attacks , limit use 30 tablet 1  ? sertraline (ZOLOFT) 100 MG tablet Take 1 tablet (100 mg total) by mouth daily. Take daily at 8 AM 90 tablet 0  ? ?No current facility-administered medications for this visit.  ? ? ? ?Musculoskeletal: ?Strength & Muscle Tone:  UTA ?Gait & Station:  Seated ?Patient leans: N/A ? ?Psychiatric Specialty Exam: ?Review of Systems  ?Constitutional:  Positive for fatigue.  ?Psychiatric/Behavioral:  Positive for sleep disturbance. The patient is nervous/anxious.   ?All other systems  reviewed and are negative.  ?There were no vitals taken for this visit.There is no height or weight on file to calculate BMI.  ?General Appearance: Casual  ?Eye Contact:  Fair  ?Speech:  Clear and Coherent  ?Volume:  Normal  ?Mood:  Anxious improving  ?Affect:  Congruent  ?Thought Process:  Goal Directed and Descriptions of Associations: Intact  ?Orientation:  Full (Time, Place, and Person)  ?Thought Content: Logical   ?Suicidal Thoughts:  No  ?Homicidal Thoughts:  No  ?Memory:  Immediate;   Fair ?Recent;   Fair ?Remote;   Fair  ?Judgement:  Fair  ?Insight:  Fair  ?Psychomotor Activity:  Normal  ?Concentration:  Concentration: Fair and Attention Span: Fair  ?Recall:  Fair  ?Fund of Knowledge: Fair  ?Language: Fair  ?Akathisia:  No  ?Handed:  Right  ?AIMS (if indicated): not done  ?Assets:  Communication Skills ?Desire for Improvement ?Housing ?  Social Support  ?ADL's:  Intact  ?Cognition: WNL  ?Sleep:   Excessive  ? ?Screenings: ?GAD-7   ? ?Flowsheet Row Office Visit from 02/12/2021 in [facility] Office Visit from 01/01/2021 in [facility] Office Visit from 12/18/2020 in [facility]  ?Total GAD-7 Score 5 6 6   ? ?  ? ?PHQ2-9   ? ?Flowsheet Row Office Visit from 04/01/2021 in [facility] Office Visit from 02/12/2021 in [facility] Office Visit from 01/01/2021 in [facility] Office Visit from 12/18/2020 in [facility]  ?PHQ-2 Total Score 1 2 1 1   ?PHQ-9 Total Score 7 7 2 10   ? ?  ? ?Flowsheet Row Office Visit from 04/01/2021 in [facility] ED from 02/21/2021 in [facility] EMERGENCY DEPARTMENT Office Visit from 02/12/2021 in [facility]  ?C-SSRS RISK CATEGORY No Risk No Risk No Risk  ? ?  ? ? ? ?Assessment and Plan: Christian Moore is a 21 year old  Caucasian male, junior at [employer], Christian Moore in [employer], lives on campus, has a history of GAD, panic attacks, fatigue, excessive sleepiness, was evaluated by telemedicine today.  Patient with some i

## 2021-04-28 ENCOUNTER — Ambulatory Visit (HOSPITAL_COMMUNITY): Payer: 59 | Attending: Medical

## 2021-04-28 ENCOUNTER — Other Ambulatory Visit: Payer: Self-pay

## 2021-04-28 ENCOUNTER — Encounter (HOSPITAL_COMMUNITY): Payer: Self-pay | Admitting: *Deleted

## 2021-04-28 DIAGNOSIS — R0600 Dyspnea, unspecified: Secondary | ICD-10-CM | POA: Diagnosis not present

## 2021-04-28 DIAGNOSIS — R079 Chest pain, unspecified: Secondary | ICD-10-CM | POA: Insufficient documentation

## 2021-04-30 ENCOUNTER — Telehealth: Payer: Self-pay

## 2021-04-30 NOTE — Telephone Encounter (Signed)
-----   Message from Vergia Alberts, RN sent at 04/29/2021 10:25 AM EDT ----- ? ?----- Message ----- ?From: Fransico Michael, Cadence H, PA-C ?Sent: 04/29/2021  10:16 AM EDT ?To: Vergia Alberts, RN ? ?Testing showed normal cardiac and pulmonary function with good exercise capacity. Overall reassuring.  ? ?

## 2021-04-30 NOTE — Telephone Encounter (Signed)
DPR on file. Lmom with results. Patient is to contact the office if any questions. 

## 2021-05-05 ENCOUNTER — Telehealth: Payer: Self-pay

## 2021-05-05 NOTE — Telephone Encounter (Signed)
Cadence David Stall, PA-C  ?04/29/2021 10:16 AM EDT   ?  ?Testing showed normal cardiac and pulmonary function with good exercise capacity. Overall reassuring.   ? ?

## 2021-05-05 NOTE — Telephone Encounter (Signed)
-----   Message from Cadence David Stall, PA-C sent at 05/05/2021 12:04 PM EDT ----- ?PCP sent a message about OSA testing. Can we call the patient and ask if he wants to get tested for sleep apnea (sleep study). If yes, he needs referral to pulmonology. Thanks ?

## 2021-05-05 NOTE — Telephone Encounter (Signed)
Called to give the patient cpx results and Cadence Furth, [place] recommendation. Lmtcb. ?

## 2021-05-13 ENCOUNTER — Other Ambulatory Visit: Payer: Self-pay | Admitting: Psychiatry

## 2021-05-13 DIAGNOSIS — F32A Depression, unspecified: Secondary | ICD-10-CM

## 2021-05-13 DIAGNOSIS — F411 Generalized anxiety disorder: Secondary | ICD-10-CM

## 2021-05-15 ENCOUNTER — Encounter: Payer: Self-pay | Admitting: Medical

## 2021-05-15 ENCOUNTER — Ambulatory Visit (INDEPENDENT_AMBULATORY_CARE_PROVIDER_SITE_OTHER): Payer: 59 | Admitting: Medical

## 2021-05-15 VITALS — BP 100/80 | HR 64 | Ht 73.0 in | Wt 166.2 lb

## 2021-05-15 DIAGNOSIS — U099 Post covid-19 condition, unspecified: Secondary | ICD-10-CM | POA: Diagnosis not present

## 2021-05-15 DIAGNOSIS — R079 Chest pain, unspecified: Secondary | ICD-10-CM

## 2021-05-15 DIAGNOSIS — R0602 Shortness of breath: Secondary | ICD-10-CM | POA: Diagnosis not present

## 2021-05-15 NOTE — Progress Notes (Signed)
?Cardiology Office Note:   ? ?Date:  05/15/2021  ? ?ID:  Christian Moore, DOB Feb 28, 2000, MRN [id number] ? ?PCP:  Patient, No Pcp Per (Inactive)  ?[facility] Cardiologist:Dr. End ?[facility] Electrophysiologist:  None  ? ?Referring MD: No ref. provider found  ? ?Chief Complaint: testing follow-up ? ?History of Present Illness:   ? ?Christian Moore is a 21 y.o. male with a hx of with a hx of COVID-19, anxiety/panic attacks who presents for follow-up.  ?  ?He presented to the [facility] emergency department on 02/21/2021 complaining of increased fatigue.  He notes that he has not been able to exercise as much at the gym as he experiences some chest tightness and shortness of breath with activity.  He was concerned about the potential for myocarditis, having had COVID-19 in 08/2020. ?  ?Seen 03/05/21 and reported chest discomfort since COVID-19. Also had SOB and increasing fatigue since COVID-19. Echo was ordered which showed LVEF 50-55%, otherwise unremarkable. ? ?Last seen 03/30/21 and reported persistent chest pain. Cardiopulmonary test was ordered which was unremarkable.  ? ?Today, the cardiopulmonary stress test was reviewed. The patient reports his symptoms are the same, no improvement. The patient is thinking about starting exercise again.He has been taking it easy. He may try weights and cardio. He is trying to establish with PCP. We discussed referral to pulmonology.  ? ?Past Medical History:  ?Diagnosis Date  ? Anxiety   ? Panic attacks   ? ? ?Past Surgical History:  ?Procedure Laterality Date  ? CYSTOSCOPY    ? LAPAROSCOPIC APPENDECTOMY N/A 10/24/2018  ? Procedure: APPENDECTOMY LAPAROSCOPIC;  Surgeon: Henrene Dodge, MD;  Location: [facility] ORS;  Service: General;  Laterality: N/A;  ? TONSILLECTOMY    ? UPPER GASTROINTESTINAL ENDOSCOPY    ? ? ?Current Medications: ?Current Meds  ?Medication Sig  ? buPROPion (WELLBUTRIN XL) 150 MG 24 hr tablet TAKE 1 TABLET (150 MG TOTAL) BY MOUTH DAILY. DOSE INCREASE  ? propranolol  (INDERAL) 10 MG tablet Take 1 tablet (10 mg total) by mouth daily as needed. For severe anxiety attacks , limit use  ? sertraline (ZOLOFT) 100 MG tablet Take 1 tablet (100 mg total) by mouth daily. Take daily at 8 AM  ?  ? ?Allergies:   Patient has no known allergies.  ? ?Social History  ? ?Socioeconomic History  ? Marital status: Single  ?  Spouse name: Not on file  ? Number of children: 0  ? Years of education: Not on file  ? Highest education level: Not on file  ?Occupational History  ? Not on file  ?Tobacco Use  ? Smoking status: Never  ? Smokeless tobacco: Never  ?Vaping Use  ? Vaping Use: Former  ?Substance and Sexual Activity  ? Alcohol use: Yes  ?  Alcohol/week: 1.0 standard drink  ?  Types: 1 Cans of beer per week  ?  Comment: currently drinks 1 beer per day; 1-2 years ago (was binge drinking)  ? Drug use: Never  ? Sexual activity: Not Currently  ?  Partners: Female  ?Other Topics Concern  ? Not on file  ?Social History Narrative  ? Not on file  ? ?Social Determinants of Health  ? ?Financial Resource Strain: Not on file  ?Food Insecurity: Not on file  ?Transportation Needs: Not on file  ?Physical Activity: Not on file  ?Stress: Not on file  ?Social Connections: Not on file  ?  ? ?Family History: ?The patient's family history includes Anxiety disorder in his mother;  Heart attack in his maternal grandmother; Hyperlipidemia in his father; Hypertension in his father. ? ?ROS:   ?Please see the history of present illness.    ? All other systems reviewed and are negative. ? ?EKGs/Labs/Other Studies Reviewed:   ? ?The following studies were reviewed today: ? ?Cardiopulmonary exercise test 04/2019 ? ?Conclusion: Exercise testing with gas exchange demonstrates normal functional capacity when compared to matched sedentary norms. There is no indication for cardiopulmonary abnormality. Post-exercise spirometry demonstrated a mildly reduced FEV1 of -7% post exercise. Although this presents abnormality, it does not meet  diagnostic criteria for exercise-induced bronchospasm.  ? ?Echo 03/2030 ? 1. Left ventricular ejection fraction, by estimation, is 50 to 55%. The  ?left ventricle has low normal function. The left ventricle has no regional  ?wall motion abnormalities. Left ventricular diastolic parameters were  ?normal.  ? 2. Right ventricular systolic function is normal. The right ventricular  ?size is normal.  ? 3. The mitral valve is normal in structure. No evidence of mitral valve  ?regurgitation.  ? 4. The aortic valve is tricuspid. Aortic valve regurgitation is not  ?visualized.  ? 5. The inferior vena cava is normal in size with greater than 50%  ?respiratory variability, suggesting right atrial pressure of 3 mmHg.  ? ?EKG:  EKG is ordered today.  The ekg ordered today demonstrates NSR, 64bpm, TWI aVL no changes ? ?Recent Labs: ?02/21/2021: BUN 13; Creatinine, Ser 0.86; Hemoglobin 16.1; Platelets 252; Potassium 3.6; Sodium 137; TSH 1.766  ?Recent Lipid Panel ?No results found for: CHOL, TRIG, HDL, CHOLHDL, VLDL, LDLCALC, LDLDIRECT ? ? ?Physical Exam:   ? ?VS:  BP 100/80 (BP Location: Left Arm, Patient Position: Sitting, Cuff Size: Normal)   Pulse 64   Ht 6\' 1"  (1.854 m)   Wt 166 lb 4 oz (75.4 kg)   SpO2 98%   BMI 21.93 kg/m?    ? ?Wt Readings from Last 3 Encounters:  ?05/15/21 166 lb 4 oz (75.4 kg)  ?03/30/21 164 lb (74.4 kg)  ?03/05/21 163 lb (73.9 kg)  ?  ? ?GEN:  Well nourished, well developed in no acute distress ?HEENT: Normal ?NECK: No JVD; No carotid bruits ?LYMPHATICS: No lymphadenopathy ?CARDIAC: RRR, no murmurs, rubs, gallops ?RESPIRATORY:  Clear to auscultation without rales, wheezing or rhonchi  ?ABDOMEN: Soft, non-tender, non-distended ?MUSCULOSKELETAL:  No edema; No deformity  ?SKIN: Warm and dry ?NEUROLOGIC:  Alert and oriented x 3 ?PSYCHIATRIC:  Normal affect  ? ?ASSESSMENT:   ? ?1. Post-COVID chronic shortness of breath   ?2. Shortness of breath   ?3. Chest pain, unspecified type   ? ?PLAN:   ? ?In order  of problems listed above: ? ?Post COVID SOB and chest pain ?Patient has unchanged SOB, chest pain and fatigue. Cardiopulmonary test was essentially normal, may have some exercise induced asthma. Discussed sleep apnea, but does not appear to have symptoms concerning for this. He is wondering about starting to exercise again, I think this is reasonable. He plans on slowly trying weights and cardio. I will refer to pulmonology for any further recommendation.  ? ?Disposition: Follow up in 6 month(s) with MD/APP  ? ?Signed, ?Tama Grosz 05/03/21, PA-C  ?05/15/2021 4:23 PM    ?[facility]  ?

## 2021-05-15 NOTE — Telephone Encounter (Signed)
Patient seen in office 05/15/21 ?

## 2021-05-15 NOTE — Patient Instructions (Signed)
Medication Instructions:  ?Your physician recommends that you continue on your current medications as directed. Please refer to the Current Medication list given to you today. ? ?*If you need a refill on your cardiac medications before your next appointment, please call your pharmacy* ? ? ?Lab Work: ?None ordered ?If you have labs (blood work) drawn today and your tests are completely normal, you will receive your results only by: ?MyChart Message (if you have MyChart) OR ?A paper copy in the mail ?If you have any lab test that is abnormal or we need to change your treatment, we will call you to review the results. ? ? ?Testing/Procedures: ?None ordered ? ? ?Follow-Up: ?At [facility], you and your health needs are our priority.  As part of our continuing mission to provide you with exceptional heart care, we have created designated Provider Care Teams.  These Care Teams include your primary Cardiologist (physician) and Advanced Practice Providers (APPs -  Physician Assistants and Nurse Practitioners) who all work together to provide you with the care you need, when you need it. ? ?We recommend signing up for the patient portal called "MyChart".  Sign up information is provided on this After Visit Summary.  MyChart is used to connect with patients for Virtual Visits (Telemedicine).  Patients are able to view lab/test results, encounter notes, upcoming appointments, etc.  Non-urgent messages can be sent to your provider as well.   ?To learn more about what you can do with MyChart, go to [email or web address].   ? ?Your next appointment:   ?Your physician wants you to follow-up in: 6 months You will receive a reminder letter in the mail two months in advance. If you don't receive a letter, please call our office to schedule the follow-up appointment. ? ? ?The format for your next appointment:   ?In Person ? ?Provider:   ?You may see Yvonne Kendall, MD or one of the following Advanced Practice Providers on your  designated Care Team:   ?Nicolasa Ducking, NP ?Eula Listen, PA-C ?Cadence Fransico Michael, PA-C ? ? ?Other Instructions ?You have been referred to [facility]. Their office will contact you to schedule. ?  ? ?Important Information About Sugar ? ? ? ? ? ? ?

## 2021-06-11 ENCOUNTER — Telehealth (INDEPENDENT_AMBULATORY_CARE_PROVIDER_SITE_OTHER): Payer: Self-pay | Admitting: Psychiatry

## 2021-06-11 DIAGNOSIS — F411 Generalized anxiety disorder: Secondary | ICD-10-CM

## 2021-06-11 NOTE — Progress Notes (Signed)
No response to call or text or video invite  

## 2021-06-13 ENCOUNTER — Other Ambulatory Visit: Payer: Self-pay | Admitting: Psychiatry

## 2021-06-13 DIAGNOSIS — F3289 Other specified depressive episodes: Secondary | ICD-10-CM

## 2021-06-13 DIAGNOSIS — F411 Generalized anxiety disorder: Secondary | ICD-10-CM

## 2021-06-15 ENCOUNTER — Other Ambulatory Visit: Payer: Self-pay | Admitting: Psychiatry

## 2021-06-15 DIAGNOSIS — F3289 Other specified depressive episodes: Secondary | ICD-10-CM

## 2021-06-15 DIAGNOSIS — F411 Generalized anxiety disorder: Secondary | ICD-10-CM

## 2021-07-08 ENCOUNTER — Telehealth: Payer: Self-pay

## 2021-07-08 DIAGNOSIS — F411 Generalized anxiety disorder: Secondary | ICD-10-CM

## 2021-07-08 DIAGNOSIS — F32A Depression, unspecified: Secondary | ICD-10-CM

## 2021-07-08 DIAGNOSIS — F3289 Other specified depressive episodes: Secondary | ICD-10-CM

## 2021-07-08 MED ORDER — PROPRANOLOL HCL 10 MG PO TABS: 10.0000 mg | ORAL_TABLET | Freq: Every day | ORAL | 2 refills | Status: DC | PRN

## 2021-07-08 MED ORDER — BUPROPION HCL ER (XL) 150 MG PO TB24: 150.0000 mg | ORAL_TABLET | Freq: Every day | ORAL | 0 refills | Status: DC

## 2021-07-08 MED ORDER — SERTRALINE HCL 100 MG PO TABS: 100.0000 mg | ORAL_TABLET | Freq: Every day | ORAL | 0 refills | Status: DC

## 2021-07-08 NOTE — Telephone Encounter (Signed)
pt called states he needs refills on all his medication to last until 09-19-[facility] [place] [place]

## 2021-07-08 NOTE — Telephone Encounter (Signed)
I have sent Wellbutrin, sertraline and [facility]pranolol to [facility] [place].

## 2021-07-28 ENCOUNTER — Other Ambulatory Visit: Payer: Self-pay | Admitting: Psychiatry

## 2021-07-28 DIAGNOSIS — F3289 Other specified depressive episodes: Secondary | ICD-10-CM

## 2021-09-03 ENCOUNTER — Other Ambulatory Visit: Payer: Self-pay | Admitting: Psychiatry

## 2021-09-03 DIAGNOSIS — F3289 Other specified depressive episodes: Secondary | ICD-10-CM

## 2021-09-03 DIAGNOSIS — F411 Generalized anxiety disorder: Secondary | ICD-10-CM

## 2021-10-08 ENCOUNTER — Other Ambulatory Visit: Payer: Self-pay | Admitting: Psychiatry

## 2021-11-02 ENCOUNTER — Telehealth: Payer: Self-pay | Admitting: Psychiatry

## 2021-11-02 DIAGNOSIS — F3289 Other specified depressive episodes: Secondary | ICD-10-CM

## 2021-11-02 MED ORDER — BUPROPION HCL ER (XL) 150 MG PO TB24: 150.0000 mg | ORAL_TABLET | Freq: Every day | ORAL | 0 refills | Status: DC

## 2021-11-02 NOTE — Telephone Encounter (Signed)
Patient called to schedule follow up appt due to being [place]. Made appt for 12-10-21 mychart visit. States he will need a refill on his Wellbutrin. Please advise

## 2021-11-02 NOTE — Telephone Encounter (Signed)
Contacted patient to discuss, I have [facility] [address] and you 90 days supply.

## 2021-11-24 ENCOUNTER — Other Ambulatory Visit: Payer: Self-pay | Admitting: Psychiatry

## 2021-11-24 DIAGNOSIS — F3289 Other specified depressive episodes: Secondary | ICD-10-CM

## 2021-11-24 DIAGNOSIS — F411 Generalized anxiety disorder: Secondary | ICD-10-CM

## 2021-11-27 ENCOUNTER — Other Ambulatory Visit: Payer: Self-pay | Admitting: Psychiatry

## 2021-11-27 DIAGNOSIS — F3289 Other specified depressive episodes: Secondary | ICD-10-CM

## 2021-12-10 ENCOUNTER — Telehealth (INDEPENDENT_AMBULATORY_CARE_PROVIDER_SITE_OTHER): Payer: 59 | Admitting: Psychiatry

## 2021-12-10 DIAGNOSIS — Z91199 Patient's noncompliance with other medical treatment and regimen due to unspecified reason: Secondary | ICD-10-CM | POA: Insufficient documentation

## 2021-12-10 NOTE — Progress Notes (Signed)
No response to call or text or video invite  

## 2021-12-27 ENCOUNTER — Other Ambulatory Visit: Payer: Self-pay | Admitting: Psychiatry

## 2021-12-27 DIAGNOSIS — F3289 Other specified depressive episodes: Secondary | ICD-10-CM

## 2022-01-30 ENCOUNTER — Other Ambulatory Visit: Payer: Self-pay | Admitting: Psychiatry

## 2022-01-30 DIAGNOSIS — F3289 Other specified depressive episodes: Secondary | ICD-10-CM

## 2022-02-02 NOTE — Telephone Encounter (Signed)
Patient has been NO show for appointment, not seen in a long time. No refills without appointment.

## 2022-02-02 NOTE — Telephone Encounter (Signed)
Patient was not last seen 06/11/21. He No showed that appointment.

## 2022-02-02 NOTE — Telephone Encounter (Signed)
received fax requesting a refill for a 90 day supply for the bupropion

## 2022-02-02 NOTE — Telephone Encounter (Signed)
Patient no showed his appointment in May 2023 so his last appointment was way before that.

## 2022-02-02 NOTE — Telephone Encounter (Signed)
called patient and left a message that he has not been seen since 06-2021 and he needs to make an appt to get refills on medication.

## 2022-03-02 ENCOUNTER — Telehealth (INDEPENDENT_AMBULATORY_CARE_PROVIDER_SITE_OTHER): Payer: 59 | Admitting: Psychiatry

## 2022-03-02 ENCOUNTER — Encounter: Payer: Self-pay | Admitting: Psychiatry

## 2022-03-02 DIAGNOSIS — F410 Panic disorder [episodic paroxysmal anxiety] without agoraphobia: Secondary | ICD-10-CM

## 2022-03-02 DIAGNOSIS — F411 Generalized anxiety disorder: Secondary | ICD-10-CM | POA: Diagnosis not present

## 2022-03-02 DIAGNOSIS — F3289 Other specified depressive episodes: Secondary | ICD-10-CM

## 2022-03-02 MED ORDER — BUPROPION HCL 75 MG PO TABS: 75.0000 mg | ORAL_TABLET | Freq: Every morning | ORAL | 1 refills | Status: DC

## 2022-03-02 MED ORDER — SERTRALINE HCL 100 MG PO TABS: 100.0000 mg | ORAL_TABLET | Freq: Every day | ORAL | 0 refills | Status: DC

## 2022-03-02 NOTE — Progress Notes (Signed)
Virtual Visit via Video Note  I connected with Christian Moore on 03/02/22 at  3:00 PM EST by a video enabled telemedicine application and verified that I am speaking with the correct person using two identifiers.  Location Provider Location : [facility] Patient Location : Home  Participants: Patient , Provider    I discussed the limitations of evaluation and management by telemedicine and the availability of in person appointments. The patient expressed understanding and agreed to proceed.   I discussed the assessment and treatment plan with the patient. The patient was provided an opportunity to ask questions and all were answered. The patient agreed with the plan and demonstrated an understanding of the instructions.   The patient was advised to call back or seek an in-person evaluation if the symptoms worsen or if the condition fails to improve as anticipated.    [facility] MD OP Progress Note  03/02/2022 3:18 PM [name]  MRN:  [id number]  Chief Complaint:  Chief Complaint  Patient presents with   Follow-up   Medication Refill   Anxiety   Depression   HPI: Christian Moore is a 22 year old Caucasian male, currently a senior at [employer], lives on campus, has a history of GAD, panic attacks, fatigue was evaluated by telemedicine today.  Patient's last visit was on 04/23/2021.  Patient today reports he was busy with school and recently also had to go to [place] for an elective that he took.  Patient reports he enjoyed his time there.  Reports he just returned from [place] and is currently  jetlagged.  He also has cold-like symptoms likely from traveling.   Patient reports overall anxiety symptoms are manageable.  Denies any significant panic attacks.  Reports he does not have any significant fatigue concentration problems at this time.  Does not have any significant depressive symptoms.  Patient denies any suicidality, homicidality or perceptual disturbances.  Would like to come  off of some of these medications if possible.  Agreeable to starting a trial of tapering off of the Wellbutrin first if possible.  Patient denies any other concerns.   Visit Diagnosis:    ICD-10-CM   1. GAD (generalized anxiety disorder)  F41.1 buPROPion (WELLBUTRIN) 75 MG tablet    sertraline (ZOLOFT) 100 MG tablet    2. Panic attacks  F41.0     3. 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  Past trials of Zoloft.  Past Medical History:  Past Medical History:  Diagnosis Date   Anxiety    Panic attacks     Past Surgical History:  Procedure Laterality Date   CYSTOSCOPY     LAPAROSCOPIC APPENDECTOMY N/A 10/24/2018   Procedure: APPENDECTOMY LAPAROSCOPIC;  Surgeon: Henrene Dodge, MD;  Location: [facility] ORS;  Service: General;  Laterality: N/A;   TONSILLECTOMY     UPPER GASTROINTESTINAL ENDOSCOPY      Family Psychiatric History: Reviewed family psychiatric history from progress note on 12/18/2020.  Family History:  Family History  Problem Relation Age of Onset   Anxiety disorder Mother    Hyperlipidemia Father    Hypertension Father    Heart attack Maternal Grandmother     Social History: Reviewed social history from progress note on 12/18/2020. Social History   Socioeconomic History   Marital status: Single    Spouse name: Not on file   Number  of children: 0   Years of education: Not on file   Highest education level: Not on file  Occupational History   Not on file  Tobacco Use   Smoking status: Never   Smokeless tobacco: Never  Vaping Use   Vaping Use: Former  Substance and Sexual Activity   Alcohol use: Yes    Alcohol/week: 1.0 standard drink of alcohol    Types: 1 Cans of beer per week    Comment: currently drinks 1 beer per day; 1-2 years ago (was binge drinking)   Drug use: Never    Sexual activity: Not Currently    Partners: Female  Other Topics Concern   Not on file  Social History Narrative   Not on file   Social Determinants of Health   Financial Resource Strain: Not on file  Food Insecurity: Not on file  Transportation Needs: Not on file  Physical Activity: Not on file  Stress: Not on file  Social Connections: Not on file    Allergies: No Known Allergies  Metabolic Disorder Labs: No results found for: "HGBA1C", "MPG" No results found for: "PROLACTIN" No results found for: "CHOL", "TRIG", "HDL", "CHOLHDL", "VLDL", "LDLCALC" Lab Results  Component Value Date   TSH 1.766 02/21/2021   TSH 2.924 12/18/2020    Therapeutic Level Labs: No results found for: "LITHIUM" No results found for: "VALPROATE" No results found for: "CBMZ"  Current Medications: Current Outpatient Medications  Medication Sig Dispense Refill   buPROPion (WELLBUTRIN) 75 MG tablet Take 1 tablet (75 mg total) by mouth in the morning. 30 tablet 1   propranolol (INDERAL) 10 MG tablet Take 1 tablet (10 mg total) by mouth daily as needed. For severe anxiety attacks , limit use 30 tablet 2   sertraline (ZOLOFT) 100 MG tablet Take 1 tablet (100 mg total) by mouth daily. Take daily at 8 AM 90 tablet 0   No current facility-administered medications for this visit.     Musculoskeletal: Strength & Muscle Tone:  UTA Gait & Station:  Seated Patient leans: N/A  Psychiatric Specialty Exam: Review of Systems  HENT:  Positive for congestion.   Psychiatric/Behavioral: Negative.    All other systems reviewed and are negative.   There were no vitals taken for this visit.There is no height or weight on file to calculate BMI.  General Appearance: Casual  Eye Contact:  Fair  Speech:  Normal Rate  Volume:  Normal  Mood:  Euthymic  Affect:  Congruent  Thought Process:  Goal Directed and Descriptions of Associations: Intact  Orientation:  Full (Time, Place, and Person)  Thought Content:  Logical   Suicidal Thoughts:  No  Homicidal Thoughts:  No  Memory:  Immediate;   Fair Recent;   Fair Remote;   Fair  Judgement:  Fair  Insight:  Fair  Psychomotor Activity:  Normal  Concentration:  Concentration: Fair and Attention Span: Fair  Recall:  [employer] of Knowledge: Fair  Language: Fair  Akathisia:  No  Handed:  Right  AIMS (if indicated): not done  Assets:  Communication Skills Desire for Improvement Housing Social Support  ADL's:  Intact  Cognition: WNL  Sleep:  Fair   Screenings: GAD-7    Flowsheet Row Video Visit from 03/02/2022 in [facility] Office Visit from 02/12/2021 in [facility] Office Visit from 01/01/2021 in [facility] Office Visit from 12/18/2020 in [facility]  Total  GAD-7 Score 1 5 6 6       PHQ2-9    Flowsheet Row Video Visit from 03/02/2022 in [facility] Office Visit from 04/01/2021 in [facility] Office Visit from 02/12/2021 in [facility] Office Visit from 01/01/2021 in [facility] Office Visit from 12/18/2020 in [facility]  PHQ-2 Total Score 0 1 2 1 1   PHQ-9 Total Score 0 7 7 2 10       Flowsheet Row Video Visit from 03/02/2022 in [facility] Office Visit from 04/01/2021 in [facility] ED from 02/21/2021 in [facility] Emergency Department at [facility]  C-SSRS RISK CATEGORY No Risk No Risk No Risk        Assessment and Plan: Donterius Ridolfi is a 22 year old Caucasian male, senior at [employer], has a history of GAD, panic attacks, was evaluated by telemedicine today.  Patient is interested in  tapering off of some of the medications reports he is currently stable with regards to his mood.  Discussed plan as noted below.  Plan  GAD-stable Continue Zoloft 100 mg p.o. daily for now. This is a reduced dosage.   Panic attacks-stable Propranolol 10 mg p.o. daily as needed Zoloft 100 mg p.o. daily  Depression, other specified-in remission Taper off Wellbutrin based on patient request, reduce Wellbutrin to 75 mg p.o. daily Continue Zoloft 100 mg p.o. daily  Follow-up in clinic in 4 to 6 weeks or sooner if needed. Collaboration of Care: Collaboration of Care: Primary Care Provider AEB encouraged patient to follow up with primary care provider or health clinic at [facility] for cold like symptoms.  Patient/Guardian was advised Release of Information must be obtained prior to any record release in order to collaborate their care with an outside provider. Patient/Guardian was advised if they have not already done so to contact the registration department to sign all necessary forms in order for [place] to release information regarding their care.   Consent: Patient/Guardian gives verbal consent for treatment and assignment of benefits for services provided during this visit. Patient/Guardian expressed understanding and agreed to proceed.   This note was generated in part or whole with voice recognition software. Voice recognition is usually quite accurate but there are transcription errors that can and very often do occur. I apologize for any typographical errors that were not detected and corrected.      Jomarie Longs, MD 03/03/2022, 8:28 AM

## 2022-03-26 ENCOUNTER — Other Ambulatory Visit: Payer: Self-pay | Admitting: Psychiatry

## 2022-03-26 DIAGNOSIS — F411 Generalized anxiety disorder: Secondary | ICD-10-CM

## 2022-03-26 DIAGNOSIS — F3289 Other specified depressive episodes: Secondary | ICD-10-CM

## 2022-04-07 ENCOUNTER — Encounter: Payer: Self-pay | Admitting: Psychiatry

## 2022-04-07 ENCOUNTER — Ambulatory Visit (INDEPENDENT_AMBULATORY_CARE_PROVIDER_SITE_OTHER): Payer: 59 | Admitting: Psychiatry

## 2022-04-07 VITALS — BP 119/73 | HR 71 | Temp 97.8°F | Ht 73.0 in | Wt 167.4 lb

## 2022-04-07 DIAGNOSIS — F410 Panic disorder [episodic paroxysmal anxiety] without agoraphobia: Secondary | ICD-10-CM | POA: Diagnosis not present

## 2022-04-07 DIAGNOSIS — F3289 Other specified depressive episodes: Secondary | ICD-10-CM | POA: Diagnosis not present

## 2022-04-07 DIAGNOSIS — F411 Generalized anxiety disorder: Secondary | ICD-10-CM

## 2022-04-07 NOTE — Progress Notes (Unsigned)
[place] MD OP Progress Note  04/07/2022 5:01 PM Christian Moore  MRN:  [id number]  Chief Complaint:  Chief Complaint  Patient presents with   Follow-up   Anxiety   Depression   Medication Refill   HPI: Christian Moore is a 22 year old Caucasian male, currently a senior at [employer], lives on campus, has a history of GAD, panic attacks, was evaluated in office today.  Patient today reports overall he is doing fairly well with regards to his mood.  Since being on the lower dosage of Wellbutrin he has not noticed any significant changes in his mood symptoms.  He has noticed some anxiety regarding whether he has forgotten something or not on a daily basis however nothing overwhelming or distressing.  Patient reports he recently went on a hike and after he got on top and as he looked out and saw the town below he went into a panic mode for a few minutes although he did not have a full-blown panic attack.  He was able to sit down and relax with the help of a friend and it resolved in a few minutes.  He did not have to take any as needed medication.  Patient denies any significant sadness, hopelessness.  Denies any suicidality, homicidality or perceptual disturbances.  Patient reports sleep is overall good.  Currently compliant on the Zoloft, lower dosage of Wellbutrin.  Patient denies any side effects.  Patient report he is planning to move to [facility] in the fall.  He is going to graduate in May 2024 from [employer].  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  That is going to be an adjustment since his girlfriend is going to be here at [facility] doing her internship.  He would also miss his friends.  However he believes and is hopeful that he will be able to cope with the changes.  Patient denies any other concerns today.  Visit Diagnosis:    ICD-10-CM   1. GAD (generalized anxiety disorder)  F41.1     2. Panic attacks  F41.0     3. 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  Past trials of Zoloft.  Past Medical History:  Past Medical History:  Diagnosis Date   Anxiety    Panic attacks     Past Surgical History:  Procedure Laterality Date   CYSTOSCOPY     LAPAROSCOPIC APPENDECTOMY N/A 10/24/2018   Procedure: APPENDECTOMY LAPAROSCOPIC;  Surgeon: Olean Ree, MD;  Location: [facility] ORS;  Service: General;  Laterality: N/A;   TONSILLECTOMY     UPPER GASTROINTESTINAL ENDOSCOPY      Family Psychiatric History: Reviewed family psychiatric history from progress note on 12/18/2020.  Family History:  Family History  Problem Relation Age of Onset   Anxiety disorder Mother    Hyperlipidemia Father    Hypertension Father    Heart attack Maternal Grandmother     Social History: Reviewed social history from progress note on 12/18/2020. Social History   Socioeconomic History   Marital status: Single    Spouse name: Not on file   Number of children: 0   Years of education: Not on file   Highest education level: Not on file  Occupational History   Not on file  Tobacco Use   Smoking status: Never   Smokeless tobacco: Never  Vaping Use   Vaping Use: Former  Substance and Sexual Activity   Alcohol use: Yes    Alcohol/week: 1.0 standard drink of alcohol    Types: 1 Cans of beer per week    Comment: currently drinks 1 beer per day; 1-2 years ago (was binge drinking)   Drug use: Never   Sexual activity: Not Currently    Partners: Female  Other Topics Concern   Not on file  Social History Narrative   Not on file   Social Determinants of Health   Financial Resource Strain: Not on file  Food Insecurity: Not on file  Transportation Needs: Not on file  Physical Activity: Not on file  Stress: Not on file  Social Connections: Not on file    Allergies: No Known Allergies  Metabolic Disorder Labs: No results found  for: "HGBA1C", "MPG" No results found for: "PROLACTIN" No results found for: "CHOL", "TRIG", "HDL", "CHOLHDL", "VLDL", "LDLCALC" Lab Results  Component Value Date   TSH 1.766 02/21/2021   TSH 2.924 12/18/2020    Therapeutic Level Labs: No results found for: "LITHIUM" No results found for: "VALPROATE" No results found for: "CBMZ"  Current Medications: Current Outpatient Medications  Medication Sig Dispense Refill   buPROPion (WELLBUTRIN) 75 MG tablet Take 1 tablet (75 mg total) by mouth in the morning. 30 tablet 1   fexofenadine (ALLEGRA) 180 MG tablet Take 180 mg by mouth daily.     propranolol (INDERAL) 10 MG tablet Take 1 tablet (10 mg total) by mouth daily as needed. For severe anxiety attacks , limit use 30 tablet 2   sertraline (ZOLOFT) 100 MG tablet Take 1 tablet (100 mg total) by mouth daily. Take daily at 8 AM 90 tablet 0   No current facility-administered medications for this visit.     Musculoskeletal: Strength & Muscle Tone: within normal limits Gait & Station: normal Patient leans: N/A  Psychiatric Specialty Exam: Review of Systems  Psychiatric/Behavioral: Negative.    All other systems reviewed and are negative.   Blood pressure 119/73, pulse 71, temperature 97.8 F (36.6 C), temperature source Skin, height '6\' 1"'$  (1.854 m), weight 167 lb 6.4 oz (75.9 kg).Body mass index is 22.09 kg/m.  General Appearance: Casual  Eye Contact:  Fair  Speech:  Clear and Coherent  Volume:  Normal  Mood:  Euthymic  Affect:  Congruent  Thought Process:  Goal Directed and Descriptions of Associations: Intact  Orientation:  Full (Time, Place, and Person)  Thought Content: Logical   Suicidal Thoughts:  No  Homicidal Thoughts:  No  Memory:  Immediate;   Fair Recent;   Fair Remote;   Fair  Judgement:  Fair  Insight:  Fair  Psychomotor Activity:  Normal  Concentration:  Concentration: Fair and Attention Span: Fair  Recall:  [employer] of Knowledge: Fair  Language: Fair   Akathisia:  No  Handed:  Right  AIMS (if indicated): not done  Assets:  Communication Skills Desire for [place] Talents/Skills Transportation  ADL's:  Intact  Cognition: WNL  Sleep:  Fair   Screenings: GAD-7    Personnel officer Visit from 04/07/2022 in [place] Video Visit from 03/02/2022 in [place] Office Visit from 02/12/2021 in [place] Office Visit from 01/01/2021 in [place] Office Visit from 12/18/2020 in [facility]  Total GAD-7 Score '5 1 5 6 6      '$ [phone]  [place] Office Visit from 04/07/2022 in [place] Video Visit from 03/02/2022 in [place] Office Visit from 04/01/2021 in [place] Office Visit from 02/12/2021 in [place] Office Visit from 01/01/2021 in [place]  PHQ-2 Total Score 0 0 '1 2 1  '$ PHQ-9 Total Score 4 0 '7 7 2      '$ [place] Office Visit from 04/07/2022 in [place] Video Visit from 03/02/2022 in [place] Office Visit from 04/01/2021 in [place] No Risk No Risk No Risk        Assessment and Plan: Christian Moore is a 22 year old Caucasian male, senior at [employer], has a history of GAD, panic attacks was evaluated in office today.  Patient is currently tolerating the lower dosage of Wellbutrin well, would like to stay on the current combination of medication, will benefit from the following plan.  Plan GAD-stable Zoloft 100 mg p.o.  daily.  Panic attacks-stable Propranolol 10 mg p.o. daily as needed Zoloft 100 mg p.o. daily  Depression, other specified-in remission Wellbutrin  75 mg p.o. daily, reduced dosage Zoloft 100 mg p.o. daily   Follow-up in clinic in 3 to 4 months or sooner if needed.   Consent: Patient/Guardian gives verbal consent for treatment and assignment of benefits for services provided during this visit. Patient/Guardian expressed understanding and agreed to proceed.    This note was generated in part or whole with voice recognition software. Voice recognition is usually quite accurate but there are transcription errors that can and very often do occur. I apologize for any typographical errors that were not detected and corrected.     Ursula Alert, MD 04/07/2022, 5:01 PM

## 2022-04-18 ENCOUNTER — Emergency Department
Admission: EM | Admit: 2022-04-18 | Discharge: 2022-04-18 | Disposition: A | Discharge status: Home / Self Care | Payer: 59 | Attending: Emergency Medicine | Admitting: Emergency Medicine

## 2022-04-18 ENCOUNTER — Encounter: Payer: Self-pay | Admitting: Intensive Care

## 2022-04-18 ENCOUNTER — Emergency Department: Payer: 59

## 2022-04-18 ENCOUNTER — Other Ambulatory Visit: Payer: Self-pay

## 2022-04-18 DIAGNOSIS — F411 Generalized anxiety disorder: Secondary | ICD-10-CM

## 2022-04-18 DIAGNOSIS — R0789 Other chest pain: Secondary | ICD-10-CM | POA: Insufficient documentation

## 2022-04-18 DIAGNOSIS — F32A Depression, unspecified: Secondary | ICD-10-CM

## 2022-04-18 LAB — BASIC METABOLIC PANEL
Anion gap: 7 (ref 5–15)
BUN: 17 mg/dL (ref 6–20)
CO2: 27 mmol/L (ref 22–32)
Calcium: 9.4 mg/dL (ref 8.9–10.3)
Chloride: 103 mmol/L (ref 98–111)
Creatinine, Ser: 1.06 mg/dL (ref 0.61–1.24)
GFR, Estimated: >60 mL/min (ref >60)
Glucose, Bld: 105 mg/dL — ABNORMAL HIGH (ref 70–99)
Potassium: 4.3 mmol/L (ref 3.5–5.1)
Sodium: 137 mmol/L (ref 135–145)

## 2022-04-18 LAB — CBC
HCT: 48.8 % (ref 39.0–52.0)
Hemoglobin: 16 g/dL (ref 13.0–17.0)
MCH: 27.1 pg (ref 26.0–34.0)
MCHC: 32.8 g/dL (ref 30.0–36.0)
MCV: 82.7 fL (ref 80.0–100.0)
Platelets: 265 10*3/uL (ref 150–400)
RBC: 5.9 MIL/uL — ABNORMAL HIGH (ref 4.22–5.81)
RDW: 12.3 % (ref 11.5–15.5)
WBC: 7.8 10*3/uL (ref 4.0–10.5)
nRBC: 0 % (ref 0.0–0.2)

## 2022-04-18 LAB — TROPONIN I (HIGH SENSITIVITY)
Troponin I (High Sensitivity): <2 ng/L (ref <18)
Troponin I (High Sensitivity): <2 ng/L (ref <18)

## 2022-04-18 MED ORDER — PROPRANOLOL HCL 10 MG PO TABS: 10.0000 mg | ORAL_TABLET | Freq: Two times a day (BID) | ORAL | 0 refills | Status: DC | PRN

## 2022-04-18 NOTE — ED Provider Notes (Signed)
[facility] Provider Note    Event Date/Time   First MD Initiated Contact with Patient 04/18/22 2205     (approximate)   History   Chief Complaint: Chest Pain   HPI  Christian Moore is a 22 y.o. male with a past history of generalized anxiety disorder and panic attacks who comes the ED complaining of left-sided chest discomfort for the past week, episodic, associated with a feeling of "impending doom."  He takes Zoloft and Wellbutrin and is under the care of a psychiatrist.  2 weeks ago, he decreased his Wellbutrin dose from 150 mg to 75 mg.  Chest pain has no aggravating or alleviating factors.  No significant shortness of breath radiation diaphoresis or vomiting.  Not pleuritic, not exertional.     Physical Exam   Triage Vital Signs: ED Triage Vitals  Enc Vitals Group     BP 04/18/22 1850 116/79     Pulse Rate 04/18/22 1850 71     Resp 04/18/22 1850 18     Temp 04/18/22 1850 98.2 F (36.8 C)     Temp Source 04/18/22 1850 Oral     SpO2 04/18/22 1850 98 %     Weight 04/18/22 1851 165 lb (74.8 kg)     Height 04/18/22 1851 6\' 1"  (1.854 m)     Head Circumference --      Peak Flow --      Pain Score 04/18/22 1851 3     Pain Loc --      Pain Edu? --      Excl. in [place]? --     Most recent vital signs: Vitals:   04/18/22 1850 04/18/22 2244  BP: 116/79 117/76  Pulse: 71 66  Resp: 18 18  Temp: 98.2 F (36.8 C) 98 F (36.7 C)  SpO2: 98% 98%    General: Awake, no distress.  CV:  Good peripheral perfusion.  Regular rate and rhythm, no murmurs.  Normal distal pulses Resp:  Normal effort.  Clear to auscultation bilaterally Abd:  No distention.  Soft nontender Other:  No lower extremity edema, no calf tenderness.  Left chest wall is tender to the touch in the area of indicated pain reproducing his symptoms   ED Results / Procedures / Treatments   Labs (all labs ordered are listed, but only abnormal results are displayed) Labs Reviewed  BASIC  METABOLIC PANEL - Abnormal; Notable for the following components:      Result Value   Glucose, Bld 105 (*)    All other components within normal limits  CBC - Abnormal; Notable for the following components:   RBC 5.90 (*)    All other components within normal limits  TROPONIN I (HIGH SENSITIVITY)  TROPONIN I (HIGH SENSITIVITY)     EKG Interpreted by me Normal sinus rhythm rate of 71.  Normal axis intervals QRS ST segments and T waves   RADIOLOGY Chest x-ray interpreted by me, appears normal.  Radiology report reviewed   PROCEDURES:  Procedures   MEDICATIONS ORDERED IN ED: Medications - No data to display   IMPRESSION / MDM / [place] / ED COURSE  I reviewed the triage vital signs and the nursing notes.  DDx: Anxiety/panic, chest wall strain, pericarditis, electrolyte abnormality, anemia, pneumothorax  Patient's presentation is most consistent with acute presentation with potential threat to life or bodily function.  Patient presents with atypical chest pain. Considering the patient's symptoms, medical history, and physical examination today, I have low  suspicion for ACS, PE, TAD, pneumothorax, carditis, mediastinitis, pneumonia, CHF, or sepsis.  I think symptoms are due to chest wall strain, musculoskeletal origin, with exaggerated stress response and anxiety which may also be related to recent decrease in his Wellbutrin.  EKG chest x-ray labs are all unremarkable, vital signs are normal.  Stable for discharge       FINAL CLINICAL IMPRESSION(S) / ED DIAGNOSES   Final diagnoses:  Atypical chest pain     Rx / DC Orders   ED Discharge Orders          Ordered    propranolol (INDERAL) 10 MG tablet  2 times daily PRN        04/18/22 2300             Note:  This document was prepared using Dragon voice recognition software and may include unintentional dictation errors.   Carrie Mew, MD 04/19/22 0010

## 2022-04-18 NOTE — Discharge Instructions (Signed)
Your EKG, chest x-ray, and lab test today were all okay.  Please follow-up with your doctor to consider increasing your Wellbutrin back to 150 mg.  You can resume taking propranolol as needed help with the symptoms as well.

## 2022-04-18 NOTE — ED Triage Notes (Signed)
Patient c/o left sided chest aches X1 week. Also describes as pulsating. Reports lightheadedness and SOB with pain

## 2022-04-28 ENCOUNTER — Other Ambulatory Visit: Payer: Self-pay | Admitting: Psychiatry

## 2022-04-28 DIAGNOSIS — F411 Generalized anxiety disorder: Secondary | ICD-10-CM

## 2022-04-28 DIAGNOSIS — F3289 Other specified depressive episodes: Secondary | ICD-10-CM

## 2022-05-03 ENCOUNTER — Telehealth: Payer: Self-pay

## 2022-05-03 NOTE — Telephone Encounter (Signed)
Attempted to contact patient to discuss, had to leave a voicemail. 

## 2022-05-03 NOTE — Telephone Encounter (Signed)
pt called left a message that you went down on his medication and now he like to go back to reg dosage. he states that he is having alot of anxiety.

## 2022-05-23 ENCOUNTER — Other Ambulatory Visit: Payer: Self-pay | Admitting: Psychiatry

## 2022-05-23 DIAGNOSIS — F411 Generalized anxiety disorder: Secondary | ICD-10-CM

## 2022-05-23 DIAGNOSIS — F3289 Other specified depressive episodes: Secondary | ICD-10-CM

## 2022-05-31 ENCOUNTER — Ambulatory Visit: Payer: 59 | Attending: Medical | Admitting: Medical

## 2022-05-31 ENCOUNTER — Encounter: Payer: Self-pay | Admitting: Medical

## 2022-05-31 VITALS — BP 100/68 | HR 70 | Ht 73.0 in | Wt 162.4 lb

## 2022-05-31 DIAGNOSIS — R079 Chest pain, unspecified: Secondary | ICD-10-CM

## 2022-05-31 DIAGNOSIS — F411 Generalized anxiety disorder: Secondary | ICD-10-CM

## 2022-05-31 NOTE — Patient Instructions (Signed)
Medication Instructions:  Your physician recommends that you continue on your current medications as directed. Please refer to the Current Medication list given to you today.  *If you need a refill on your cardiac medications before your next appointment, please call your pharmacy*   Lab Work:  No lab work ordered today.  If you have labs (blood work) drawn today and your tests are completely normal, you will receive your results only by: MyChart Message (if you have MyChart) OR A paper copy in the mail If you have any lab test that is abnormal or we need to change your treatment, we will call you to review the results.   Testing/Procedures:  No testing ordered today.   Follow-Up: At [facility], you and your health needs are our priority.  As part of our continuing mission to provide you with exceptional heart care, we have created designated Provider Care Teams.  These Care Teams include your primary Cardiologist (physician) and Advanced Practice Providers (APPs -  Physician Assistants and Nurse Practitioners) who all work together to provide you with the care you need, when you need it.  We recommend signing up for the patient portal called "MyChart".  Sign up information is provided on this After Visit Summary.  MyChart is used to connect with patients for Virtual Visits (Telemedicine).  Patients are able to view lab/test results, encounter notes, upcoming appointments, etc.  Non-urgent messages can be sent to your provider as well.   To learn more about what you can do with MyChart, go to [email or web address].    Your next appointment:     Follow up as needed.  Other Instructions  Please contact:  [facility] [phone]

## 2022-05-31 NOTE — Progress Notes (Signed)
Cardiology Office Note:    Date:  05/31/2022   ID:  Christian Moore, DOB May 12, 2000, MRN [id number]  PCP:  Patient, No Pcp Per  [facility] Cardiologist:  None  [facility] Electrophysiologist:  None   Referring MD: No ref. provider found   Chief Complaint: ER follow-up  History of Present Illness:    Christian Moore is a 22 y.o. male with a hx of COVID-19, anxiety/panic attacks who presents for ER follow-up.    He presented to the [facility] emergency department on 02/21/2021 complaining of increased fatigue.  He notes that he has not been able to exercise as much at the gym as he experiences some chest tightness and shortness of breath with activity.  He was concerned about the potential for myocarditis, having had COVID-19 in 08/2020.   He was subsequently seen in the cardiology office 03/05/21 and reported chest discomfort since COVID-19. Also had SOB and increasing fatigue since COVID-19. Echo was ordered which showed LVEF 50-55%, otherwise unremarkable.   He was seen 03/30/21 and reported persistent chest pain. Cardiopulmonary test was ordered, which was unremarkable.   Last seen 05/15/2021 and reported unchanged symptoms. It was recommended he start exercising again.  Patient was referred to pulmonology for any further recommendations.  Patient was seen in the ER 04/18/2022 for chest pain, likely component of anxiety.  Workup was negative and patient was discharged home with propranolol.  Today, the patient reports atypical chest pain. Symptoms are similar than prior pain, but in different spots. Can be a sharp pain or tight feeling. He did try and run but felt nervous. He previously decreased Wellbutrin, and felt this may have affected symptoms. He feels anxiety affects symptoms and may have somatic symptoms. He is requesting a referral to psychologist/therapist.   Past Medical History:  Diagnosis Date   Anxiety    Panic attacks     Past Surgical History:  Procedure Laterality Date    CYSTOSCOPY     LAPAROSCOPIC APPENDECTOMY N/A 10/24/2018   Procedure: APPENDECTOMY LAPAROSCOPIC;  Surgeon: Henrene Dodge, MD;  Location: [facility] ORS;  Service: General;  Laterality: N/A;   TONSILLECTOMY     UPPER GASTROINTESTINAL ENDOSCOPY      Current Medications: Current Meds  Medication Sig   buPROPion (WELLBUTRIN) 75 MG tablet TAKE 1 TABLET (75 MG TOTAL) BY MOUTH IN THE MORNING   fexofenadine (ALLEGRA) 180 MG tablet Take 180 mg by mouth daily.   propranolol (INDERAL) 10 MG tablet Take 1 tablet (10 mg total) by mouth 2 (two) times daily as needed for up to 15 days. For severe anxiety attacks , limit use   sertraline (ZOLOFT) 100 MG tablet Take 1 tablet (100 mg total) by mouth daily. Take daily at 8 AM     Allergies:   Molds & smuts   Social History   Socioeconomic History   Marital status: Single    Spouse name: Not on file   Number of children: 0   Years of education: Not on file   Highest education level: Not on file  Occupational History   Not on file  Tobacco Use   Smoking status: Never   Smokeless tobacco: Never  Vaping Use   Vaping Use: Former  Substance and Sexual Activity   Alcohol use: Yes    Alcohol/week: 3.0 standard drinks of alcohol    Types: 3 Cans of beer per week    Comment: currently drinks 1 beer per day; 1-2 years ago (was binge drinking)   Drug use:  Never   Sexual activity: Not Currently    Partners: Female  Other Topics Concern   Not on file  Social History Narrative   Not on file   Social Determinants of Health   Financial Resource Strain: Not on file  Food Insecurity: Not on file  Transportation Needs: Not on file  Physical Activity: Not on file  Stress: Not on file  Social Connections: Not on file     Family History: The patient's family history includes Anxiety disorder in his mother; Heart attack in his maternal grandmother; Hyperlipidemia in his father; Hypertension in his father.  ROS:   Please see the history of present illness.      All other systems reviewed and are negative.  EKGs/Labs/Other Studies Reviewed:    The following studies were reviewed today:  Cardiopulmonary exercise test 04/2019  Conclusion: Exercise testing with gas exchange demonstrates normal functional capacity when compared to matched sedentary norms. There is no indication for cardiopulmonary abnormality. Post-exercise spirometry demonstrated a mildly reduced FEV1 of -7% post exercise. Although this presents abnormality, it does not meet diagnostic criteria for exercise-induced bronchospasm.    Echo 03/2030  1. Left ventricular ejection fraction, by estimation, is 50 to 55%. The  left ventricle has low normal function. The left ventricle has no regional  wall motion abnormalities. Left ventricular diastolic parameters were  normal.   2. Right ventricular systolic function is normal. The right ventricular  size is normal.   3. The mitral valve is normal in structure. No evidence of mitral valve  regurgitation.   4. The aortic valve is tricuspid. Aortic valve regurgitation is not  visualized.   5. The inferior vena cava is normal in size with greater than 50%  respiratory variability, suggesting right atrial pressure of 3 mmHg.   EKG:  EKG is ordered today.  The ekg ordered today demonstrates NSR, 70bpm, TWI aVL  Recent Labs: 04/18/2022: BUN 17; Creatinine, Ser 1.06; Hemoglobin 16.0; Platelets 265; Potassium 4.3; Sodium 137  Recent Lipid Panel No results found for: "CHOL", "TRIG", "HDL", "CHOLHDL", "VLDL", "LDLCALC", "LDLDIRECT"   Physical Exam:    VS:  BP 100/68 (BP Location: Left Arm, Patient Position: Sitting, Cuff Size: Normal)   Pulse 70   Ht 6\' 1"  (1.854 m)   Wt 162 lb 6.4 oz (73.7 kg)   SpO2 97%   BMI 21.43 kg/m     Wt Readings from Last 3 Encounters:  05/31/22 162 lb 6.4 oz (73.7 kg)  04/18/22 165 lb (74.8 kg)  05/15/21 166 lb 4 oz (75.4 kg)     GEN:  Well nourished, well developed in no acute distress HEENT:  Normal NECK: No JVD; No carotid bruits LYMPHATICS: No lymphadenopathy CARDIAC: RRR, no murmurs, rubs, gallops RESPIRATORY:  Clear to auscultation without rales, wheezing or rhonchi  ABDOMEN: Soft, non-tender, non-distended MUSCULOSKELETAL:  No edema; No deformity  SKIN: Warm and dry NEUROLOGIC:  Alert and oriented x 3 PSYCHIATRIC:  Normal affect   ASSESSMENT:    1. Chest pain, unspecified type   2. GAD (generalized anxiety disorder)    PLAN:    In order of problems listed above:  Chest pain  Recent ER visit for atypical chest pain with negative work up, suspected anxiety-related. A month ago he decreased his Wellbutrin dose to 75mg  and he started noticing atypical chest pain. He went back up on his dose to 150mg  but continued to have chest pain, which is why he went to the ER.  He does feel  symptoms are most likely somatic and related to anxiety. Prior echo and cardiopulmonary testing in 2023 were unremarkable. EKG shows NSR with no ischemic changes. Prior to a month ago, he was exercising normally. Long discussion around anxiety and possible work-up. Patient agrees anxiety is a contributing factor and dose not want to pursue any further ischemic work-up, which I agree with. He still does not have a PCP. [facility] is [facility], we will provide this.   Disposition: Follow up prn with MD/APP    Signed, Angeles Paolucci David Stall, PA-C  05/31/2022 11:37 AM    [facility]

## 2022-06-15 ENCOUNTER — Telehealth: Payer: Self-pay | Admitting: Internal Medicine

## 2022-06-15 NOTE — Telephone Encounter (Signed)
Patient requesting psychiatric referral be sent in, but when he called they said they have not received referral. Requesting another be sent to the location.

## 2022-06-15 NOTE — Telephone Encounter (Signed)
Left a message for the patient to call back.  

## 2022-06-16 ENCOUNTER — Telehealth (INDEPENDENT_AMBULATORY_CARE_PROVIDER_SITE_OTHER): Payer: 59 | Admitting: Psychiatry

## 2022-06-16 ENCOUNTER — Encounter: Payer: Self-pay | Admitting: Psychiatry

## 2022-06-16 DIAGNOSIS — F410 Panic disorder [episodic paroxysmal anxiety] without agoraphobia: Secondary | ICD-10-CM | POA: Diagnosis not present

## 2022-06-16 DIAGNOSIS — F411 Generalized anxiety disorder: Secondary | ICD-10-CM | POA: Diagnosis not present

## 2022-06-16 DIAGNOSIS — F3289 Other specified depressive episodes: Secondary | ICD-10-CM | POA: Diagnosis not present

## 2022-06-16 MED ORDER — SERTRALINE HCL 100 MG PO TABS: 150.0000 mg | ORAL_TABLET | Freq: Every day | ORAL | 0 refills | Status: DC

## 2022-06-16 MED ORDER — ALPRAZOLAM 0.25 MG PO TABS: 0.2500 mg | ORAL_TABLET | Freq: Every day | ORAL | 0 refills | Status: AC | PRN

## 2022-06-16 NOTE — Patient Instructions (Signed)
Alprazolam Tablets What is this medication? ALPRAZOLAM (al PRAY zoe lam) treats anxiety. It works by helping your nervous system calm down. It belongs to a group of medications called benzodiazepines. This medicine may be used for other purposes; ask your health care provider or pharmacist if you have questions. COMMON BRAND NAME(S): Xanax What should I tell my care team before I take this medication? They need to know if you have any of these conditions: Kidney disease Liver disease Lung disease, such as asthma or breathing problems Mental health condition Seizures Substance use disorder Suicidal thoughts, plans, or attempt by you or a family member An unusual or allergic reaction to alprazolam, other medications, foods, dyes, or preservatives Pregnant or trying to get pregnant Breastfeeding How should I use this medication? Take this medication by mouth. Take it as directed on the prescription label. Do not take it more often than directed. Keep taking it unless your care team tells you to stop. A special MedGuide will be given to you by the pharmacist with each prescription and refill. Be sure to read this information carefully each time. Talk to your care team about the use of this medication in children. Special care may be needed. People 65 years and older may have a stronger reaction and need a smaller dose. Overdosage: If you think you have taken too much of this medicine contact a poison control center or emergency room at once. NOTE: This medicine is only for you. Do not share this medicine with others. What if I miss a dose? If you miss a dose, take it as soon as you can. If it is almost time for your next dose, take only that dose. Do not take double or extra doses. What may interact with this medication? Do not take this medication with any of the following: Adagrasib Certain antivirals for HIV or hepatitis Certain medications for fungal infections, such as ketoconazole,  itraconazole, or posaconazole Clarithromycin Grapefruit juice Opioid medications for cough Sodium oxybate This medication may also interact with the following: Alcohol Antihistamines for allergy, cough and cold Certain medications for anxiety or sleep Certain medications for depression, such as amitriptyline, fluoxetine, fluvoxamine, nefazodone, sertraline Certain medications for seizures, such as carbamazepine, phenobarbital, phenytoin, primidone Cimetidine Digoxin Erythromycin Estrogen or progestin hormones General anesthetics, such as halothane, isoflurane, methoxyflurane, propofol Medications that relax muscles Opioid medications for pain Phenothiazines, such as chlorpromazine, mesoridazine, prochlorperazine, thioridazine This list may not describe all possible interactions. Give your health care provider a list of all the medicines, herbs, non-prescription drugs, or dietary supplements you use. Also tell them if you smoke, drink alcohol, or use illegal drugs. Some items may interact with your medicine. What should I watch for while using this medication? Visit your care team for regular checks on your progress. Tell your care team if your symptoms do not start to get better or if they get worse. Do not stop taking except on your care team's advice. You may develop a severe reaction. Your care team will tell you how much medication to take. Long term use of this medication may cause your brain and body to depend on it. This can happen even when used as directed by your care team. You and your care team will work together to determine how long you will need to take this medication. This medication may affect your coordination, reaction time, or judgment. Do not drive or operate machinery until you know how this medication affects you. Sit up or stand slowly to   reduce the risk of dizzy or fainting spells. Drinking alcohol with this medication can increase the risk of these side effects. If  you are taking another medication that also causes drowsiness, you may have more side effects. Give your care team a list of all medications you use. Your care team will tell you how much medication to take. Do not take more medication than directed. Get emergency help right away if you have problems breathing or unusual sleepiness. If you or your family notice any changes in your behavior, such as new or worsening depression, thoughts of harming yourself, anxiety, other unusual or disturbing thoughts, or memory loss, call your care team right away. Talk to your care team if you wish to become pregnant or think you might be pregnant. This medication can cause serious birth defects. Talk to your care team before breastfeeding. Changes to your treatment plan may be needed. What side effects may I notice from receiving this medication? Side effects that you should report to your care team as soon as possible: Allergic reactions--skin rash, itching, hives, swelling of the face, lips, tongue, or throat CNS depression--slow or shallow breathing, shortness of breath, feeling faint, dizziness, confusion, trouble staying awake Thoughts of suicide or self-harm, worsening mood, feelings of depression Side effects that usually do not require medical attention (report to your care team if they continue or are bothersome): Change in sex drive or performance Dizziness Drowsiness Nausea This list may not describe all possible side effects. Call your doctor for medical advice about side effects. You may report side effects to FDA at 1-800-FDA-1088. Where should I keep my medication? Keep out of the reach of children and pets. This medication can be abused. Keep it in a safe place to protect it from theft. Do not share it with anyone. It is only for you. Selling or giving away this medication is dangerous and against the law. Store at room temperature between 20 and 25 degrees C (68 and 77 degrees F). Get rid of any  unused medication after the expiration date. This medication may cause harm and death if it is taken by other adults, children, or pets. It is important to get rid of the medication as soon as you no longer need it, or it is expired. You can do this in two ways: Take the medication to a medication take-back program. Check with your pharmacy or law enforcement to find a location. If you cannot return the medication, check the label or package insert to see if the medication should be thrown out in the garbage or flushed down the toilet. If you are not sure, ask your care team. If it is safe to put it in the trash, take the medication out of the container. Mix the medication with cat litter, dirt, coffee grounds, or other unwanted substance. Seal the mixture in a bag or container. Put it in the trash. NOTE: This sheet is a summary. It may not cover all possible information. If you have questions about this medicine, talk to your doctor, pharmacist, or health care provider.  2023 Elsevier/Gold Standard (2021-01-29 00:00:00)

## 2022-06-16 NOTE — Telephone Encounter (Signed)
Spoke with rep from [employer] who stated they have not received referral.  Rep provided nurse fax number  Referral manually faxed to their office Nurse left detail message per DPR to make pt aware.

## 2022-06-16 NOTE — Progress Notes (Signed)
Virtual Visit via Video Note  I connected with Christian Moore on 06/16/22 at  8:30 AM EDT by a video enabled telemedicine application and verified that I am speaking with the correct person using two identifiers.  Location Provider Location : [facility] Patient Location : Home  Participants: Patient , Provider   I discussed the limitations of evaluation and management by telemedicine and the availability of in person appointments. The patient expressed understanding and agreed to proceed.   I discussed the assessment and treatment plan with the patient. The patient was provided an opportunity to ask questions and all were answered. The patient agreed with the plan and demonstrated an understanding of the instructions.   The patient was advised to call back or seek an in-person evaluation if the symptoms worsen or if the condition fails to improve as anticipated.  [facility] MD OP Progress Note  06/16/2022 9:19 AM Christian Moore  MRN:  [id number]  Chief Complaint:  Chief Complaint  Patient presents with   Follow-up   Panic Attack   Anxiety   Medication Refill   HPI: Christian Moore is a 22 year old Caucasian male, currently a senior at [employer], lives on campus, has a history of GAD, panic attacks was evaluated by telemedicine today.  Patient recent worsening of anxiety symptoms.  Patient today describes anxiety symptoms as chest pain, feeling overwhelmed, heart palpitation, impending doom.  Patient reports when he has severe anxiety symptoms he feels as though he is going to get a heart attack.  He had telehealth appointment recently with [facility] , and he was advised to go back up on the Wellbutrin to 150 mg daily.  Patient however reports since being up on the Wellbutrin to 150 mg he may have noticed worsening anxiety symptoms and it has not helped with anything.  Patient does have propranolol available as needed for anxiety however he has never tried it.  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  He will be graduating next Friday.  He is going back home for the summer and is planning to start work in [place] in the fall.  Looks forward to that.  Patient denies any depression symptoms other than decrease in appetite in the past few weeks likely due to his anxiety.  Patient reports sleep as good.  Denies any suicidality, homicidality or perceptual disturbances.  Patient denies any other concerns today.    Visit Diagnosis:    ICD-10-CM   1. GAD (generalized anxiety disorder)  F41.1 sertraline (ZOLOFT) 100 MG tablet    2. Panic attacks  F41.0 ALPRAZolam (XANAX) 0.25 MG tablet    3. 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  Past trials of Zoloft.  Past Medical History:  Past Medical History:  Diagnosis Date   Anxiety    Panic attacks     Past Surgical History:  Procedure Laterality Date   CYSTOSCOPY     LAPAROSCOPIC APPENDECTOMY N/A 10/24/2018   Procedure: APPENDECTOMY LAPAROSCOPIC;  Surgeon: Henrene Dodge, MD;  Location: [facility] ORS;  Service: General;  Laterality: N/A;   TONSILLECTOMY     UPPER GASTROINTESTINAL ENDOSCOPY      Family Psychiatric History: I have reviewed family psychiatric history from progress note on 12/18/2020.  Family History:  Family History  Problem Relation Age of Onset   Anxiety disorder  Mother    Hyperlipidemia Father    Hypertension Father    Heart attack Maternal Grandmother     Social History: I have reviewed social history from progress note on 12/18/2020. Social History   Socioeconomic History   Marital status: Single    Spouse name: Not on file   Number of children: 0   Years of education: Not on file   Highest education level: Not on file  Occupational History   Not on file  Tobacco Use   Smoking status: Never    Smokeless tobacco: Never  Vaping Use   Vaping Use: Former  Substance and Sexual Activity   Alcohol use: Yes    Alcohol/week: 3.0 standard drinks of alcohol    Types: 3 Cans of beer per week    Comment: currently drinks 1 beer per day; 1-2 years ago (was binge drinking)   Drug use: Never   Sexual activity: Not Currently    Partners: Female  Other Topics Concern   Not on file  Social History Narrative   Not on file   Social Determinants of Health   Financial Resource Strain: Not on file  Food Insecurity: Not on file  Transportation Needs: Not on file  Physical Activity: Not on file  Stress: Not on file  Social Connections: Not on file    Allergies:  Allergies  Allergen Reactions   Molds & Smuts     Metabolic Disorder Labs: No results found for: "HGBA1C", "MPG" No results found for: "PROLACTIN" No results found for: "CHOL", "TRIG", "HDL", "CHOLHDL", "VLDL", "LDLCALC" Lab Results  Component Value Date   TSH 1.766 02/21/2021   TSH 2.924 12/18/2020    Therapeutic Level Labs: No results found for: "LITHIUM" No results found for: "VALPROATE" No results found for: "CBMZ"  Current Medications: Current Outpatient Medications  Medication Sig Dispense Refill   ALPRAZolam (XANAX) 0.25 MG tablet Take 1 tablet (0.25 mg total) by mouth daily as needed for up to 10 days for anxiety. Limited supply , please use only for severe panic attacks 6 tablet 0   buPROPion (WELLBUTRIN) 75 MG tablet TAKE 1 TABLET (75 MG TOTAL) BY MOUTH IN THE MORNING 90 tablet 0   fexofenadine (ALLEGRA) 180 MG tablet Take 180 mg by mouth daily.     propranolol (INDERAL) 10 MG tablet Take 1 tablet (10 mg total) by mouth 2 (two) times daily as needed for up to 15 days. For severe anxiety attacks , limit use 20 tablet 0   sertraline (ZOLOFT) 100 MG tablet Take 1.5 tablets (150 mg total) by mouth daily. Take daily at 8 AM 135 tablet 0   No current facility-administered medications for this visit.      Musculoskeletal: Strength & Muscle Tone:  UTA Gait & Station:  Seated Patient leans: N/A  Psychiatric Specialty Exam: Review of Systems  Constitutional:  Positive for appetite change (Likely due to anxiety).  Psychiatric/Behavioral:  The patient is nervous/anxious.   All other systems reviewed and are negative.   There were no vitals taken for this visit.There is no height or weight on file to calculate BMI.  General Appearance: Casual  Eye Contact:  Good  Speech:  Clear and Coherent  Volume:  Normal  Mood:  Anxious  Affect:  Congruent  Thought Process:  Goal Directed and Descriptions of Associations: Intact  Orientation:  Full (Time, Place, and Person)  Thought Content: Logical   Suicidal Thoughts:  No  Homicidal Thoughts:  No  Memory:  Immediate;  Fair Recent;   Fair Remote;   Fair  Judgement:  Fair  Insight:  Fair  Psychomotor Activity:  Normal  Concentration:  Concentration: Fair and Attention Span: Fair  Recall:  [employer] of Knowledge: Fair  Language: Fair  Akathisia:  No  Handed:  Right  AIMS (if indicated): not done  Assets:  Communication Skills Desire for Improvement Housing Social Support  ADL's:  Intact  Cognition: WNL  Sleep:  Fair   Screenings: GAD-7    Flowsheet Row Video Visit from 06/16/2022 in [facility] Office Visit from 04/07/2022 in [facility] Video Visit from 03/02/2022 in [facility] Office Visit from 02/12/2021 in [facility] Office Visit from 01/01/2021 in [facility]  Total GAD-7 Score 12 5 1 5 6       PHQ2-9    Flowsheet Row Video Visit from 06/16/2022 in [facility] Office Visit from 04/07/2022 in [facility] Video Visit from 03/02/2022 in [facility] Office Visit from 04/01/2021 in [facility] Office Visit from 02/12/2021 in [facility]  PHQ-2 Total Score 0 0 0 1 2  PHQ-9 Total Score 2 4 0 7 7      Flowsheet Row Video Visit from 06/16/2022 in [facility] ED from 04/18/2022 in [facility] Visit from 04/07/2022 in [facility]  C-SSRS RISK CATEGORY No Risk No Risk No Risk        Assessment and Plan: Trinton Macfadyen is a 22 year old Caucasian male, senior at [employer], has a history of GAD, panic attacks was evaluated by telemedicine today.  Patient with recent worsening of anxiety, will benefit from the following plan.  Plan GAD-unstable Increase Zoloft to 150 mg p.o. daily Propranolol 10 mg p.o. daily as needed Start Xanax 0.25 mg p.o. daily as needed for severe anxiety attacks Reviewed [place] PMP AWARxE Provided education about benzodiazepine being habit forming and to limit use.  Panic attacks-unstable Patient advised to establish care with a therapist, reports he is currently on a wait list. Start Xanax 0.25 mg p.o. daily as needed Propranolol as noted above. Zoloft increased to 150 mg p.o. daily  Depression, other specified in remission Will reduce Wellbutrin to 75 mg p.o. daily since the 150 mg is worsening anxiety symptoms per report.   Follow-up in clinic as needed.  Patient is currently graduating and moving away to [place].  Patient may have to establish care with a provider at his new place.  Collaboration of Care: Collaboration of Care: Referral or follow-up with counselor/therapist AEB encouraged to establish care with a therapist.  Patient/Guardian was advised Release of Information must be obtained prior to any record release in order to collaborate their care with  an outside provider. Patient/Guardian was advised if they have not already done so to contact the registration department to sign all necessary forms in order for [place] to release information regarding their care.   Consent: Patient/Guardian gives verbal consent for treatment and assignment of benefits for services provided during this visit. Patient/Guardian expressed understanding and agreed to proceed.   This note was generated in part or whole with voice recognition software. Voice recognition is usually quite accurate but there are transcription errors that can  and very often do occur. I apologize for any typographical errors that were not detected and corrected.    Jomarie Longs, MD 06/16/2022, 9:19 AM

## 2022-07-20 ENCOUNTER — Telehealth: Payer: 59 | Admitting: Psychiatry

## 2022-08-02 ENCOUNTER — Telehealth: Payer: Self-pay | Admitting: Internal Medicine

## 2022-08-02 NOTE — Telephone Encounter (Signed)
Patient states he is returning a call, but does not know who called or what it was regarding. 

## 2022-08-02 NOTE — Telephone Encounter (Signed)
LVM advising patient that I couldn't find the reason as to why he was called.

## 2022-08-17 ENCOUNTER — Other Ambulatory Visit: Payer: Self-pay | Admitting: Psychiatry

## 2022-08-17 DIAGNOSIS — F411 Generalized anxiety disorder: Secondary | ICD-10-CM

## 2022-08-17 DIAGNOSIS — F3289 Other specified depressive episodes: Secondary | ICD-10-CM

## 2022-08-18 ENCOUNTER — Telehealth: Payer: Self-pay | Admitting: Psychiatry

## 2022-08-18 NOTE — Telephone Encounter (Signed)
Patient has an appointment scheduled for 8/20. Patient requested refill for wellbutrin.-please advise

## 2022-08-20 NOTE — Telephone Encounter (Signed)
I have sent wellbutrin to pharmacy.

## 2022-09-07 ENCOUNTER — Other Ambulatory Visit: Payer: Self-pay | Admitting: Psychiatry

## 2022-09-07 DIAGNOSIS — F3289 Other specified depressive episodes: Secondary | ICD-10-CM

## 2022-09-07 DIAGNOSIS — F411 Generalized anxiety disorder: Secondary | ICD-10-CM

## 2022-09-21 ENCOUNTER — Encounter: Payer: Self-pay | Admitting: Psychiatry

## 2022-09-21 ENCOUNTER — Telehealth: Payer: 59 | Admitting: Psychiatry

## 2022-09-21 DIAGNOSIS — F3289 Other specified depressive episodes: Secondary | ICD-10-CM | POA: Diagnosis not present

## 2022-09-21 DIAGNOSIS — F410 Panic disorder [episodic paroxysmal anxiety] without agoraphobia: Secondary | ICD-10-CM | POA: Diagnosis not present

## 2022-09-21 DIAGNOSIS — F411 Generalized anxiety disorder: Secondary | ICD-10-CM | POA: Diagnosis not present

## 2022-09-21 MED ORDER — PROPRANOLOL HCL 10 MG PO TABS: 10.0000 mg | ORAL_TABLET | Freq: Two times a day (BID) | ORAL | 1 refills | Status: DC | PRN

## 2022-09-21 NOTE — Progress Notes (Signed)
Virtual Visit via Video Note  I connected with Christian Moore on 09/21/22 at 10:30 AM EDT by a video enabled telemedicine application and verified that I am speaking with the correct person using two identifiers.  Location Provider Location : [facility] Patient Location : Home  Participants: Patient , Provider    I discussed the limitations of evaluation and management by telemedicine and the availability of in person appointments. The patient expressed understanding and agreed to proceed    I discussed the assessment and treatment plan with the patient. The patient was provided an opportunity to ask questions and all were answered. The patient agreed with the plan and demonstrated an understanding of the instructions.   The patient was advised to call back or seek an in-person evaluation if the symptoms worsen or if the condition fails to improve as anticipated.  BH MD OP Progress Note  09/21/2022 10:58 AM Christian Moore  MRN:  [id number] oh  Chief Complaint:  Chief Complaint  Patient presents with   Follow-up   Depression   Anxiety   Medication Refill   HPI: Christian Moore is a 22 year old Caucasian male, graduated from [employer], currently lives with parents in [place], has a history of GAD, other specified depression, panic attacks was evaluated by telemedicine today.  Patient today reports he is currently staying with his parents in [place] and is planning to move to [place] next month to start his job.  He looks forward to that.  Continues to have anxiety symptoms mainly when he drives at night.  He often feels something bad could happen and that triggers the anxiety.  Recently had to pull over to the side due to the anxiety.  Patient reports when he has severe anxiety, he usually calls someone for help.  Currently on medications like Zoloft, Xanax as needed.  Has not needed the Xanax.  Tolerating the dosage increase of Zoloft well.  Patient reports he is currently  in therapy, recently started therapy with a therapist in [facility].  Currently meets on a weekly basis.  Planning to get a psychological evaluation for OCD tendencies.  Patient been advised to elaborate his OCD symptoms reports he often feels something bad could happen when he drives.  This is unlikely a symptom of OCD rather a symptom of his panic, generalized anxiety.  Patient denies any suicidality, homicidality or perceptual disturbances.  Patient denies any other concerns today.  4  Visit Diagnosis:    ICD-10-CM   1. GAD (generalized anxiety disorder)  F41.1 propranolol (INDERAL) 10 MG tablet    2. Panic attacks  F41.0     3. 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  Past trials of Zoloft.  Past Medical History:  Past Medical History:  Diagnosis Date   Anxiety    Panic attacks     Past Surgical History:  Procedure Laterality Date   CYSTOSCOPY     LAPAROSCOPIC APPENDECTOMY N/A 10/24/2018   Procedure: APPENDECTOMY LAPAROSCOPIC;  Surgeon: Henrene Dodge, MD;  Location: [facility] ORS;  Service: General;  Laterality: N/A;   TONSILLECTOMY     UPPER GASTROINTESTINAL ENDOSCOPY      Family Psychiatric History: I have reviewed family psychiatric history from progress note on 12/18/2020.  Family History:  Family History  Problem Relation Age of Onset   Anxiety disorder Mother    Hyperlipidemia Father  Hypertension Father    Heart attack Maternal Grandmother     Social History: I have reviewed social history from progress note on 12/18/2020. Social History   Socioeconomic History   Marital status: Single    Spouse name: Not on file   Number of children: 0   Years of education: Not on file   Highest education level: Not on file  Occupational History   Not on file  Tobacco Use   Smoking status: Never   Smokeless  tobacco: Never  Vaping Use   Vaping status: Former  Substance and Sexual Activity   Alcohol use: Yes    Alcohol/week: 3.0 standard drinks of alcohol    Types: 3 Cans of beer per week    Comment: currently drinks 1 beer per day; 1-2 years ago (was binge drinking)   Drug use: Never   Sexual activity: Not Currently    Partners: Female  Other Topics Concern   Not on file  Social History Narrative   Not on file   Social Determinants of Health   Financial Resource Strain: Not on file  Food Insecurity: Not on file  Transportation Needs: Not on file  Physical Activity: Not on file  Stress: Not on file  Social Connections: Not on file    Allergies:  Allergies  Allergen Reactions   Molds & Smuts     Metabolic Disorder Labs: No results found for: "HGBA1C", "MPG" No results found for: "PROLACTIN" No results found for: "CHOL", "TRIG", "HDL", "CHOLHDL", "VLDL", "LDLCALC" Lab Results  Component Value Date   TSH 1.766 02/21/2021   TSH 2.924 12/18/2020    Therapeutic Level Labs: No results found for: "LITHIUM" No results found for: "VALPROATE" No results found for: "CBMZ"  Current Medications: Current Outpatient Medications  Medication Sig Dispense Refill   buPROPion (WELLBUTRIN) 75 MG tablet TAKE 1 TABLET (75 MG TOTAL) BY MOUTH IN THE MORNING 90 tablet 0   fexofenadine (ALLEGRA) 180 MG tablet Take 180 mg by mouth daily.     propranolol (INDERAL) 10 MG tablet Take 1 tablet (10 mg total) by mouth 2 (two) times daily as needed. For severe anxiety attacks , limit use 60 tablet 1   sertraline (ZOLOFT) 100 MG tablet TAKE 1&1/2 TABLETS (150 MG TOTAL) BY MOUTH DAILY. TAKE DAILY AT 8 AM 135 tablet 0   No current facility-administered medications for this visit.     Musculoskeletal: Strength & Muscle Tone:  UTA Gait & Station:  Seated Patient leans: N/A  Psychiatric Specialty Exam: Review of Systems  Psychiatric/Behavioral:  The patient is nervous/anxious.     There were no  vitals taken for this visit.There is no height or weight on file to calculate BMI.  General Appearance: Fairly Groomed  Eye Contact:  Fair  Speech:  Clear and Coherent  Volume:  Normal  Mood:  Anxious  Affect:  Congruent  Thought Process:  Goal Directed and Descriptions of Associations: Intact  Orientation:  Full (Time, Place, and Person)  Thought Content: Logical   Suicidal Thoughts:  No  Homicidal Thoughts:  No  Memory:  Immediate;   Fair Recent;   Fair Remote;   Fair  Judgement:  Fair  Insight:  Fair  Psychomotor Activity:  Normal  Concentration:  Concentration: Fair and Attention Span: Fair  Recall:  [employer] of Knowledge: Fair  Language: Fair  Akathisia:  No  Handed:  Right  AIMS (if indicated): not done  Assets:  Communication Skills Desire for Improvement Housing Social Support  ADL's:  Intact  Cognition: WNL  Sleep:  Fair   Screenings: GAD-7    Flowsheet Row Video Visit from 06/16/2022 in [facility] Office Visit from 04/07/2022 in [facility] Video Visit from 03/02/2022 in [facility] Office Visit from 02/12/2021 in [facility] Office Visit from 01/01/2021 in [facility]  Total GAD-7 Score 12 5 1 5 6       PHQ2-9    Flowsheet Row Video Visit from 09/21/2022 in [facility] Video Visit from 06/16/2022 in [facility] Office Visit from 04/07/2022 in [facility] Video Visit from 03/02/2022 in [facility] Office Visit from 04/01/2021 in [facility]  PHQ-2 Total Score 0 0 0 0 1  PHQ-9 Total Score -- 2 4 0 7      Flowsheet Row Video Visit from 09/21/2022 in [facility] Video Visit from 06/16/2022 in [facility] ED from 04/18/2022 in [facility] Emergency Department at [facility]  C-SSRS RISK CATEGORY No Risk No Risk No Risk        Assessment and Plan: Christian Moore is a 22 year old Caucasian male, graduated from [employer], has a history of GAD, panic attacks was evaluated by telemedicine today.  Patient is currently having anxiety while driving a car, will benefit from medication management, psychotherapy sessions, plan as noted below.  Plan GAD-some improvement Continue CBT, recently established care with a therapist in [place]. Continue Zoloft 150 mg p.o. daily Patient declines further dosage adjustment of Zoloft today. Xanax 0.25 mg p.o. daily-short supply was prescribed last visit for severe anxiety attacks only.  Patient aware of long-term risk of benzodiazepine therapy.  Advised to limit use. Reviewed [place] PMP AWARxE  Panic attacks-unstable Patient will benefit from psychotherapy.  Patient to continue CBT Xanax 0.25 mg daily as needed for severe anxiety attacks only Start propranolol 10 mg p.o. twice daily as needed for severe anxiety attacks as well as could use it prior to driving a car.  Provided medication education.  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 daily   Collaboration of Care: Collaboration of Care: Referral or follow-up with counselor/therapist AEB patient encouraged to continue CBT  Patient/Guardian was advised Release of Information must be obtained prior to any record release in order to collaborate their care with an outside provider. Patient/Guardian was advised if they have not already done so to contact the registration department to sign all necessary forms in order for [place] to release information regarding their care.   Consent: Patient/Guardian  gives verbal consent for treatment and assignment of benefits for services provided during this visit. Patient/Guardian expressed understanding and agreed to proceed.  Patient is moving to [place], patient established with a provider in [place].  This note was generated in part or whole with voice recognition software. Voice recognition is usually quite accurate but there are transcription errors that can and very often do occur. I apologize for any typographical errors that were not detected and corrected.     Jomarie Longs, MD 09/21/2022, 10:58 AM

## 2022-10-05 ENCOUNTER — Other Ambulatory Visit: Payer: Self-pay | Admitting: Psychiatry

## 2022-10-05 DIAGNOSIS — F411 Generalized anxiety disorder: Secondary | ICD-10-CM

## 2022-11-14 ENCOUNTER — Other Ambulatory Visit: Payer: Self-pay | Admitting: Psychiatry

## 2022-11-14 DIAGNOSIS — F411 Generalized anxiety disorder: Secondary | ICD-10-CM

## 2022-11-14 DIAGNOSIS — F3289 Other specified depressive episodes: Secondary | ICD-10-CM

## 2023-11-03 ENCOUNTER — Other Ambulatory Visit: Payer: Self-pay | Admitting: Psychiatry

## 2023-11-03 DIAGNOSIS — F411 Generalized anxiety disorder: Secondary | ICD-10-CM

## 2023-11-03 DIAGNOSIS — F3289 Other specified depressive episodes: Secondary | ICD-10-CM
